# Patient Record
Sex: Male | Born: 1955 | Race: White | Hispanic: No | Smoking: Current every day smoker
Health system: Southern US, Community
[De-identification: ages and names within clinical notes are randomized; demographics above are authoritative.]

## PROBLEM LIST (undated history)

## (undated) DIAGNOSIS — F419 Anxiety disorder, unspecified: Secondary | ICD-10-CM

## (undated) DIAGNOSIS — E785 Hyperlipidemia, unspecified: Secondary | ICD-10-CM

## (undated) DIAGNOSIS — M109 Gout, unspecified: Secondary | ICD-10-CM

## (undated) DIAGNOSIS — I10 Essential (primary) hypertension: Secondary | ICD-10-CM

## (undated) DIAGNOSIS — H409 Unspecified glaucoma: Secondary | ICD-10-CM

## (undated) DIAGNOSIS — J449 Chronic obstructive pulmonary disease, unspecified: Secondary | ICD-10-CM

## (undated) HISTORY — DX: Hyperlipidemia, unspecified: E78.5

## (undated) HISTORY — DX: Anxiety disorder, unspecified: F41.9

## (undated) HISTORY — PX: HEMORRHOID SURGERY: SHX153

## (undated) HISTORY — DX: Chronic obstructive pulmonary disease, unspecified: J44.9

---

## 2002-09-22 ENCOUNTER — Encounter: Payer: Self-pay | Admitting: Family Medicine

## 2002-09-22 ENCOUNTER — Ambulatory Visit (HOSPITAL_COMMUNITY): Admission: RE | Admit: 2002-09-22 | Discharge: 2002-09-22 | Payer: Self-pay | Admitting: Family Medicine

## 2016-06-10 ENCOUNTER — Ambulatory Visit (HOSPITAL_COMMUNITY)
Admission: EM | Admit: 2016-06-10 | Discharge: 2016-06-10 | Disposition: A | Discharge status: Home / Self Care | Payer: 59 | Attending: Family Medicine | Admitting: Family Medicine

## 2016-06-10 ENCOUNTER — Ambulatory Visit (INDEPENDENT_AMBULATORY_CARE_PROVIDER_SITE_OTHER): Payer: 59

## 2016-06-10 ENCOUNTER — Encounter (HOSPITAL_COMMUNITY): Payer: Self-pay | Admitting: Nurse Practitioner

## 2016-06-10 DIAGNOSIS — M1000 Idiopathic gout, unspecified site: Secondary | ICD-10-CM | POA: Diagnosis not present

## 2016-06-10 HISTORY — DX: Unspecified glaucoma: H40.9

## 2016-06-10 HISTORY — DX: Essential (primary) hypertension: I10

## 2016-06-10 MED ORDER — COLCHICINE 0.6 MG PO TABS: 0.6000 mg | ORAL_TABLET | Freq: Two times a day (BID) | ORAL | 0 refills | Status: DC

## 2016-06-10 MED ORDER — NAPROXEN 500 MG PO TABS: 500.0000 mg | ORAL_TABLET | Freq: Two times a day (BID) | ORAL | 1 refills | Status: DC

## 2016-06-10 NOTE — ED Triage Notes (Signed)
Pt presents with c/o L foot pain and swelling. Onset after waking on Sunday. Pain has been increasingly worse since onset. He denies any injuries. Swelling, erythema and purple discoloration noted to medial aspect of L foot above great toe. Pulses intact, can wiggle digits.

## 2016-06-10 NOTE — ED Provider Notes (Signed)
[facility]    CSN: [id number] Arrival date & time: 06/10/16  [id number]     History   Chief Complaint Chief Complaint  Patient presents with  . Foot Pain    HPI Foy GuadalajaraKerry D Webb is a 60 y.o. male.   The history is provided by the patient.  Foot Pain  This is a new problem. The current episode started 2 days ago. The problem occurs constantly. The problem has been gradually worsening. The symptoms are aggravated by walking.    Past Medical History:  Diagnosis Date  . Glaucoma   . Hypertension     There are no active problems to display for this patient.   History reviewed. No pertinent surgical history.     Home Medications    Prior to Admission medications   Medication Sig Start Date End Date Taking? Authorizing Provider  latanoprost (XALATAN) 0.005 % ophthalmic solution 1 drop at bedtime.   Yes Historical Provider, MD    Family History History reviewed. No pertinent family history.  Social History Social History  Substance Use Topics  . Smoking status: Current Every Day Smoker    Packs/day: 1.00  . Smokeless tobacco: Not on file  . Alcohol use Yes     Allergies   Review of patient's allergies indicates no known allergies.   Review of Systems Review of Systems  Musculoskeletal: Positive for gait problem and joint swelling.  All other systems reviewed and are negative.    Physical Exam Triage Vital Signs ED Triage Vitals  Enc Vitals Group     BP 06/10/16 1024 (!) 194/107     Pulse Rate 06/10/16 1024 80     Resp 06/10/16 1024 16     Temp 06/10/16 1024 97.7 F (36.5 C)     Temp Source 06/10/16 1024 Oral     SpO2 06/10/16 1024 100 %     Weight --      Height --      Head Circumference --      Peak Flow --      Pain Score 06/10/16 1038 8     Pain Loc --      Pain Edu? --      Excl. in GC? --    No data found.   Updated Vital Signs BP (!) 194/107 (BP Location: Left Arm)   Pulse 80   Temp 97.7 F (36.5 C) (Oral)   Resp 16    SpO2 100%   Visual Acuity Right Eye Distance:   Left Eye Distance:   Bilateral Distance:    Right Eye Near:   Left Eye Near:    Bilateral Near:     Physical Exam  Constitutional: He is oriented to person, place, and time. He appears well-developed and well-nourished.  Musculoskeletal: He exhibits tenderness.       Left foot: There is decreased range of motion, tenderness and swelling.       Feet:  Neurological: He is alert and oriented to person, place, and time.  Nursing note and vitals reviewed.    UC Treatments / Results  Labs (all labs ordered are listed, but only abnormal results are displayed) Labs Reviewed - No data to display  EKG  EKG Interpretation None       Radiology No results found. X-rays reviewed and report per radiologist.  Procedures Procedures (including critical care time)  Medications Ordered in UC Medications - No data to display   Initial Impression / Assessment and Plan / UC  Course  I have reviewed the triage vital signs and the nursing notes.  Pertinent labs & imaging results that were available during my care of the patient were reviewed by me and considered in my medical decision making (see chart for details).  Clinical Course      Final Clinical Impressions(s) / UC Diagnoses   Final diagnoses:  None    New Prescriptions New Prescriptions   No medications on file     Linna Hoff, MD 06/13/16 2020

## 2016-09-11 ENCOUNTER — Emergency Department (HOSPITAL_COMMUNITY): Payer: PRIVATE HEALTH INSURANCE

## 2016-09-11 ENCOUNTER — Emergency Department (HOSPITAL_COMMUNITY)
Admission: EM | Admit: 2016-09-11 | Discharge: 2016-09-12 | Disposition: A | Discharge status: Home / Self Care | Payer: PRIVATE HEALTH INSURANCE | Attending: Emergency Medicine | Admitting: Emergency Medicine

## 2016-09-11 ENCOUNTER — Encounter (HOSPITAL_COMMUNITY): Payer: Self-pay | Admitting: Emergency Medicine

## 2016-09-11 DIAGNOSIS — Z79899 Other long term (current) drug therapy: Secondary | ICD-10-CM | POA: Insufficient documentation

## 2016-09-11 DIAGNOSIS — J441 Chronic obstructive pulmonary disease with (acute) exacerbation: Secondary | ICD-10-CM | POA: Diagnosis not present

## 2016-09-11 DIAGNOSIS — F17210 Nicotine dependence, cigarettes, uncomplicated: Secondary | ICD-10-CM | POA: Insufficient documentation

## 2016-09-11 DIAGNOSIS — I10 Essential (primary) hypertension: Secondary | ICD-10-CM | POA: Diagnosis not present

## 2016-09-11 DIAGNOSIS — R0602 Shortness of breath: Secondary | ICD-10-CM | POA: Diagnosis present

## 2016-09-11 HISTORY — DX: Gout, unspecified: M10.9

## 2016-09-11 LAB — COMPREHENSIVE METABOLIC PANEL
ALK PHOS: 42 U/L (ref 38–126)
ALT: 16 U/L — AB (ref 17–63)
ANION GAP: 11 (ref 5–15)
AST: 23 U/L (ref 15–41)
Albumin: 3.7 g/dL (ref 3.5–5.0)
BUN: 12 mg/dL (ref 6–20)
CALCIUM: 8.8 mg/dL — AB (ref 8.9–10.3)
CO2: 23 mmol/L (ref 22–32)
CREATININE: 1.22 mg/dL (ref 0.61–1.24)
Chloride: 100 mmol/L — ABNORMAL LOW (ref 101–111)
Glucose, Bld: 80 mg/dL (ref 65–99)
Potassium: 4.2 mmol/L (ref 3.5–5.1)
SODIUM: 134 mmol/L — AB (ref 135–145)
TOTAL PROTEIN: 7.2 g/dL (ref 6.5–8.1)
Total Bilirubin: 0.7 mg/dL (ref 0.3–1.2)

## 2016-09-11 LAB — CBC WITH DIFFERENTIAL/PLATELET
Basophils Absolute: 0 10*3/uL (ref 0.0–0.1)
Basophils Relative: 0 %
EOS ABS: 0 10*3/uL (ref 0.0–0.7)
EOS PCT: 0 %
HCT: 44.8 % (ref 39.0–52.0)
HEMOGLOBIN: 15.4 g/dL (ref 13.0–17.0)
LYMPHS ABS: 1.3 10*3/uL (ref 0.7–4.0)
LYMPHS PCT: 19 %
MCH: 32.7 pg (ref 26.0–34.0)
MCHC: 34.4 g/dL (ref 30.0–36.0)
MCV: 95.1 fL (ref 78.0–100.0)
MONOS PCT: 10 %
Monocytes Absolute: 0.7 10*3/uL (ref 0.1–1.0)
Neutro Abs: 4.8 10*3/uL (ref 1.7–7.7)
Neutrophils Relative %: 71 %
Platelets: 145 10*3/uL — ABNORMAL LOW (ref 150–400)
RBC: 4.71 MIL/uL (ref 4.22–5.81)
RDW: 12.7 % (ref 11.5–15.5)
WBC: 6.8 10*3/uL (ref 4.0–10.5)

## 2016-09-11 MED ORDER — IOPAMIDOL (ISOVUE-370) INJECTION 76%
INTRAVENOUS | Status: AC
  Administered 2016-09-11: 100 mL
  Filled 2016-09-11: qty 100

## 2016-09-11 MED ORDER — SODIUM CHLORIDE 0.9 % IV BOLUS (SEPSIS)
1000.0000 mL | Freq: Once | INTRAVENOUS | Status: AC
  Administered 2016-09-11: 1000 mL via INTRAVENOUS

## 2016-09-11 MED ORDER — ALBUTEROL SULFATE (2.5 MG/3ML) 0.083% IN NEBU
5.0000 mg | INHALATION_SOLUTION | Freq: Once | RESPIRATORY_TRACT | Status: AC
  Administered 2016-09-11: 5 mg via RESPIRATORY_TRACT
  Filled 2016-09-11: qty 6

## 2016-09-11 NOTE — ED Notes (Signed)
Pt transported to xray 

## 2016-09-11 NOTE — ED Notes (Signed)
Pt returned to room and placed back on monitor.  

## 2016-09-11 NOTE — ED Triage Notes (Signed)
Per [facility]: Pt to ED from home c/o SOB with exertion and productive cough x 1 week. Pt states the SOB has been there for about a year but significantly worsened this week with the cough. Pt stood up from couch, took a couple steps and felt SOB and dizzy. EMS VS: 186/120, 93% RA increased to 98% 2L O2 [place], CBG 86, HR 78 regular. Pt only known hx gout and glaucoma. Smokes 1ppd cigarettes. Resp currently e/u.

## 2016-09-11 NOTE — ED Notes (Signed)
Pt transported to CT ?

## 2016-09-12 LAB — INFLUENZA PANEL BY PCR (TYPE A & B)
Influenza A By PCR: NEGATIVE
Influenza B By PCR: NEGATIVE

## 2016-09-12 MED ORDER — LEVOFLOXACIN 500 MG PO TABS: 500.0000 mg | ORAL_TABLET | Freq: Every day | ORAL | 0 refills | Status: DC

## 2016-09-12 MED ORDER — ALBUTEROL SULFATE HFA 108 (90 BASE) MCG/ACT IN AERS
2.0000 | INHALATION_SPRAY | RESPIRATORY_TRACT | Status: DC
  Administered 2016-09-12: 2 via RESPIRATORY_TRACT
  Filled 2016-09-12: qty 6.7

## 2016-09-12 MED ORDER — METHYLPREDNISOLONE SODIUM SUCC 125 MG IJ SOLR: 125.0000 mg | Freq: Once | INTRAMUSCULAR | 0 refills | Status: DC

## 2016-09-12 MED ORDER — PREDNISONE 20 MG PO TABS: 40.0000 mg | ORAL_TABLET | Freq: Every day | ORAL | 0 refills | Status: AC

## 2016-09-12 MED ORDER — LEVOFLOXACIN 500 MG PO TABS
500.0000 mg | ORAL_TABLET | Freq: Once | ORAL | Status: AC
  Administered 2016-09-12: 500 mg via ORAL
  Filled 2016-09-12: qty 1

## 2016-09-12 MED ORDER — ALBUTEROL SULFATE HFA 108 (90 BASE) MCG/ACT IN AERS: 2.0000 | INHALATION_SPRAY | RESPIRATORY_TRACT | 0 refills | Status: DC | PRN

## 2016-09-12 MED ORDER — METHYLPREDNISOLONE SODIUM SUCC 125 MG IJ SOLR
125.0000 mg | Freq: Once | INTRAMUSCULAR | Status: AC
  Administered 2016-09-12: 125 mg via INTRAVENOUS
  Filled 2016-09-12: qty 2

## 2016-09-12 NOTE — ED Provider Notes (Signed)
[facility]   CSN: [id number] Arrival date & time: 09/11/16  2000     History   Chief Complaint Chief Complaint  Patient presents with  . Shortness of Breath    HPI Brent Holt is a 61 y.o. male.  HPI Patient presents to the emergency department with complaints of exertional shortness of breath and productive cough for the past week without active chest pain.  He continues to smoke cigarettes.  He's never been told he has emphysema or COPD.  He does not have bronchodilators at home.  He denies orthopnea.  Denies unilateral leg swelling.  No history of DVT report embolism.  Denies chest pain.  Symptoms are moderate in severity.  He received a breathing treatment prior to my arrival and feels better after bronchodilators   Past Medical History:  Diagnosis Date  . Glaucoma   . Gout   . Hypertension     There are no active problems to display for this patient.   History reviewed. No pertinent surgical history.     Home Medications    Prior to Admission medications   Medication Sig Start Date End Date Taking? Authorizing Provider  colchicine 0.6 MG tablet Take 1 tablet (0.6 mg total) by mouth 2 (two) times daily. Patient taking differently: Take 0.6 mg by mouth 2 (two) times daily as needed (gout pain).  06/10/16  Yes Linna Hoff, MD  dorzolamide-timolol (COSOPT) 22.3-6.8 MG/ML ophthalmic solution Place 1 drop into both eyes 2 (two) times daily.   Yes Historical Provider, MD  albuterol (PROVENTIL HFA;VENTOLIN HFA) 108 (90 Base) MCG/ACT inhaler Inhale 2 puffs into the lungs every 4 (four) hours as needed for wheezing or shortness of breath. 09/12/16   Azalia Bilis, MD  levofloxacin (LEVAQUIN) 500 MG tablet Take 1 tablet (500 mg total) by mouth daily. 09/13/16   Azalia Bilis, MD  predniSONE (DELTASONE) 20 MG tablet Take 2 tablets (40 mg total) by mouth daily. 09/12/16 09/17/16  Azalia Bilis, MD    Family History No family history on file.  Social  History Social History  Substance Use Topics  . Smoking status: Current Every Day Smoker    Packs/day: 1.00    Types: Cigarettes  . Smokeless tobacco: Never Used  . Alcohol use Yes     Allergies   Patient has no known allergies.   Review of Systems Review of Systems  All other systems reviewed and are negative.    Physical Exam Updated Vital Signs BP 155/99 (BP Location: Right Arm)   Pulse 76   Temp 98.3 F (36.8 C) (Oral)   Resp 18   Ht 6' (1.829 m)   Wt 155 lb (70.3 kg)   SpO2 96%   BMI 21.02 kg/m   Physical Exam  Constitutional: He is oriented to person, place, and time. He appears well-developed and well-nourished.  HENT:  Head: Normocephalic and atraumatic.  Eyes: EOM are normal.  Neck: Normal range of motion.  Cardiovascular: Normal rate, regular rhythm, normal heart sounds and intact distal pulses.   Pulmonary/Chest: Effort normal. No respiratory distress. He has wheezes.  Abdominal: Soft. He exhibits no distension. There is no tenderness.  Musculoskeletal: Normal range of motion.  Neurological: He is alert and oriented to person, place, and time.  Skin: Skin is warm and dry.  Psychiatric: He has a normal mood and affect. Judgment normal.  Nursing note and vitals reviewed.    ED Treatments / Results  Labs (all labs ordered are listed,  but only abnormal results are displayed) Labs Reviewed  CBC WITH DIFFERENTIAL/PLATELET - Abnormal; Notable for the following:       Result Value   Platelets 145 (*)    All other components within normal limits  COMPREHENSIVE METABOLIC PANEL - Abnormal; Notable for the following:    Sodium 134 (*)    Chloride 100 (*)    Calcium 8.8 (*)    ALT 16 (*)    All other components within normal limits  INFLUENZA PANEL BY PCR (TYPE A & B, H1N1)    EKG  EKG Interpretation  Date/Time:  Thursday September 11 2016 20:17:42 EST Ventricular Rate:  77 PR Interval:    QRS Duration: 100 QT Interval:  405 QTC  Calculation: 459 R Axis:   57 Text Interpretation:  Sinus rhythm Minimal ST depression, inferior leads Baseline wander in lead(s) V6 No old tracing to compare Confirmed by Perley Arthurs  MD, Caryn BeeKEVIN ([id number]) on 09/11/2016 9:25:02 PM       Radiology Dg Chest 2 View  Result Date: 09/11/2016 CLINICAL DATA:  Shortness of breath, productive cough. EXAM: CHEST  2 VIEW COMPARISON:  None available currently. FINDINGS: The heart size and mediastinal contours are within normal limits. No pneumothorax or pleural effusion is noted. Left lung is clear. Probable scarring seen in right mid lung with possible bulla formation. Possible nodular density is seen in this area as well. The visualized skeletal structures are unremarkable. IMPRESSION: Probable scarring with probable bulla formation seen in right midlung. Possible nodular density is seen in right midlung as well; CT scan of the chest is recommended for further evaluation. Electronically Signed   By: Lupita RaiderJames  Green Jr, M.D.   On: 09/11/2016 21:00   Ct Angio Chest Pe W And/or Wo Contrast  Result Date: 09/12/2016 CLINICAL DATA:  Exertional shortness of breath with abnormal chest x-ray EXAM: CT ANGIOGRAPHY CHEST WITH CONTRAST TECHNIQUE: Multidetector CT imaging of the chest was performed using the standard protocol during bolus administration of intravenous contrast. Multiplanar CT image reconstructions and MIPs were obtained to evaluate the vascular anatomy. CONTRAST:  100 mL Isovue 370 intravenous COMPARISON:  Chest x-ray 09/11/2016 FINDINGS: Cardiovascular: Satisfactory opacification of the pulmonary arteries to the segmental level. No evidence of pulmonary embolism. Atherosclerotic calcifications in the aorta. No aneurysm. No obvious dissection. Heart size nonenlarged. Trace pericardial effusion or thickening. Mediastinum/Nodes: Esophagus is within normal limits. No significant mediastinal adenopathy. subcentimeter nonspecific mediastinal lymph nodes. Thyroid gland normal.  Trachea is midline. Lungs/Pleura: Severe emphysematous changes are present bilaterally. No acute consolidation or pleural effusion is visualized. There is no pneumothorax. There are several pulmonary nodules. Within the subpleural left lower lobe, there is a 5 mm nodule, series 5, image number 94. Within the superior aspect of the right lower lobe, there is a 1.5 by 0.7 cm nodule. Slightly inferior and posterior to this on series 5, image number 88 is an additional hyperdense nodule measuring 0.9 by 0.6 cm. One of the nodules is felt to correspond to the radiographic abnormality. Upper Abdomen: No acute abnormality is visualized. Partially visualized small stones in the mid and upper pole of the left kidney. Musculoskeletal: No chest wall abnormality. No acute or significant osseous findings. Review of the MIP images confirms the above findings. IMPRESSION: 1. No CT evidence for acute pulmonary embolus or aortic dissection 2. Severe emphysematous changes bilaterally. Several bilateral pulmonary nodules are visualized, measuring up to 1.5 cm. No follow-up needed if patient is low-risk (and has no known or suspected primary  neoplasm). Non-contrast chest CT can be considered in 12 months if patient is high-risk. This recommendation follows the consensus statement: Guidelines for Management of Incidental Pulmonary Nodules Detected on CT Images: From the Fleischner Society 2017; Radiology 2017; 284:228-243. 3. Left kidney stones Electronically Signed   By: Jasmine Pang M.D.   On: 09/12/2016 00:05    Procedures Procedures (including critical care time)  Medications Ordered in ED Medications  albuterol (PROVENTIL HFA;VENTOLIN HFA) 108 (90 Base) MCG/ACT inhaler 2 puff (2 puffs Inhalation Given 09/12/16 0133)  albuterol (PROVENTIL) (2.5 MG/3ML) 0.083% nebulizer solution 5 mg (5 mg Nebulization Given 09/11/16 2103)  sodium chloride 0.9 % bolus 1,000 mL (0 mLs Intravenous Stopped 09/11/16 2322)  iopamidol (ISOVUE-370) 76  % injection (100 mLs  Contrast Given 09/11/16 2331)  methylPREDNISolone sodium succinate (SOLU-MEDROL) 125 mg/2 mL injection 125 mg (125 mg Intravenous Given 09/12/16 0133)  levofloxacin (LEVAQUIN) tablet 500 mg (500 mg Oral Given 09/12/16 0133)     Initial Impression / Assessment and Plan / ED Course  I have reviewed the triage vital signs and the nursing notes.  Pertinent labs & imaging results that were available during my care of the patient were reviewed by me and considered in my medical decision making (see chart for details).  Clinical Course     Patient feels better after bronchodilators.  Likely COPD exacerbation.  We'll start on a course of Levaquin despite the CT scan demonstrating no evidence of emphysema as hopefully this will decrease his risk of rebound to the ER/admission to the hospital.  Outpatient pulmonary follow-up for his pulmonary nodule as he will need repeat imaging in 6 months given his history of tobacco abuse.  I recommended that he stop smoking cigarettes.  Home with steroids, Levaquin, albuterol.  Patient understands return to the ER for new or worsening symptoms  Final Clinical Impressions(s) / ED Diagnoses   Final diagnoses:  COPD exacerbation (HCC)    New Prescriptions Discharge Medication List as of 09/12/2016  1:19 AM    START taking these medications   Details  albuterol (PROVENTIL HFA;VENTOLIN HFA) 108 (90 Base) MCG/ACT inhaler Inhale 2 puffs into the lungs every 4 (four) hours as needed for wheezing or shortness of breath., Starting Fri 09/12/2016, Print    levofloxacin (LEVAQUIN) 500 MG tablet Take 1 tablet (500 mg total) by mouth daily., Starting Sat 09/13/2016, Print    predniSONE (DELTASONE) 20 MG tablet Take 2 tablets (40 mg total) by mouth daily., Starting Fri 09/12/2016, Until Wed 09/17/2016, Print         Azalia Bilis, MD 09/12/16 [phone]

## 2016-09-12 NOTE — ED Notes (Addendum)
Patient verbalized understanding of discharge instructions and denies any further needs or questions at this time. VS stable. Patient ambulatory with steady gait.  

## 2016-09-12 NOTE — ED Notes (Signed)
MD at bedside. 

## 2016-10-07 ENCOUNTER — Ambulatory Visit (INDEPENDENT_AMBULATORY_CARE_PROVIDER_SITE_OTHER): Payer: 59 | Admitting: Emergency Medicine

## 2016-10-07 ENCOUNTER — Encounter: Payer: Self-pay | Admitting: Emergency Medicine

## 2016-10-07 VITALS — BP 190/110 | HR 84 | Ht 72.0 in | Wt 155.0 lb

## 2016-10-07 DIAGNOSIS — F17210 Nicotine dependence, cigarettes, uncomplicated: Secondary | ICD-10-CM | POA: Diagnosis not present

## 2016-10-07 DIAGNOSIS — J301 Allergic rhinitis due to pollen: Secondary | ICD-10-CM | POA: Diagnosis not present

## 2016-10-07 DIAGNOSIS — J431 Panlobular emphysema: Secondary | ICD-10-CM

## 2016-10-07 DIAGNOSIS — R918 Other nonspecific abnormal finding of lung field: Secondary | ICD-10-CM

## 2016-10-07 DIAGNOSIS — J449 Chronic obstructive pulmonary disease, unspecified: Secondary | ICD-10-CM | POA: Insufficient documentation

## 2016-10-07 DIAGNOSIS — Z720 Tobacco use: Secondary | ICD-10-CM | POA: Insufficient documentation

## 2016-10-07 DIAGNOSIS — J309 Allergic rhinitis, unspecified: Secondary | ICD-10-CM | POA: Insufficient documentation

## 2016-10-07 MED ORDER — FLUTICASONE PROPIONATE 50 MCG/ACT NA SUSP: 2.0000 | Freq: Every day | NASAL | 2 refills | Status: DC

## 2016-10-07 NOTE — Assessment & Plan Note (Addendum)
Discussed cessation with him today. He has cut down significantly. He knows that he needs to stop. We talked about plans to accomplish this. We did not set a quit date as he is not ready to do so. Discussed for 5 minutes

## 2016-10-07 NOTE — Assessment & Plan Note (Signed)
Significant emphysema and some areas of pulmonary nodularity noted on his CT scan of the chest done on 09/11/16. He is at high risk for possible lung cancer. We will repeat his CT scan in 6 months to look for interval change. Depending on the results we will plan next steps for further evaluation.

## 2016-10-07 NOTE — Assessment & Plan Note (Signed)
Significant chronic rhinitis and allergy symptoms. We will try a nasal steroid to see if he benefits.

## 2016-10-07 NOTE — Patient Instructions (Addendum)
We will perform pulmonary function testing at your next office visit.  Keep your albuterol available to use 2 puffs as needed for shortness of breath.  We will repeat your CT scan of the chest in 6 months to follow your pulmonary nodules.  Start fluticasone nasal spray, 2 sprays each nostril daily Follow with Dr Delton CoombesByrum next available with full PFT

## 2016-10-07 NOTE — Progress Notes (Signed)
Subjective:    Patient ID: Brent Holt, male    DOB: March 22, 1956, 61 y.o.   MRN: [id number]  HPI  61 year old man, current smoker (80 pack years) with hx HTN, gout, glaucoma, L rib fracture with a pneumothorax about 10 yrs ago. He went to the ED 09/12/16 with exertional dyspnea and a productive cough. ED notes indicate that he had wheezing on exam. He was treated with bronchodilators and did feel some improvement in his symptoms. He was treated with levofloxacin, prednisone, and given albuterol inhaler. He had never been diagnosed with COPD before and presents now for further evaluation. A Ct scan chest was done that shows emphysema, scattered pulm nodules. He heats w wood. He has some limitation on activities by his breathing.   Review of Systems  Constitutional: Negative.  Negative for fever and unexpected weight change.  HENT: Positive for congestion, rhinorrhea, sinus pressure and sneezing. Negative for dental problem, ear pain, nosebleeds, postnasal drip, sore throat and trouble swallowing.   Eyes: Negative.  Negative for redness and itching.  Respiratory: Positive for cough and shortness of breath. Negative for chest tightness and wheezing.   Cardiovascular: Negative.  Negative for palpitations and leg swelling.  Gastrointestinal: Negative.  Negative for nausea and vomiting.  Genitourinary: Negative.  Negative for dysuria.  Musculoskeletal: Negative.  Negative for joint swelling.  Skin: Negative.  Negative for rash.  Neurological: Negative.  Negative for headaches.  Hematological: Bruises/bleeds easily.  Psychiatric/Behavioral: Negative.  Negative for dysphoric mood. The patient is not nervous/anxious.     Past Medical History:  Diagnosis Date  . Glaucoma   . Gout   . Hypertension      No family history on file.   Social History   Social History  . Marital status: Married    Spouse name: N/A  . Number of children: N/A  . Years of education: N/A   Occupational History  .  Not on file.   Social History Main Topics  . Smoking status: Current Every Day Smoker    Packs/day: 0.50    Types: Cigarettes  . Smokeless tobacco: Never Used  . Alcohol use Yes  . Drug use: No  . Sexual activity: Not on file   Other Topics Concern  . Not on file   Social History Narrative  . 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 albuterol (PROVENTIL HFA;VENTOLIN HFA) 108 (90 Base) MCG/ACT inhaler Inhale 2 puffs into the lungs every 4 (four) hours as needed for wheezing or shortness of breath. 1 Inhaler 0  . dorzolamide-timolol (COSOPT) 22.3-6.8 MG/ML ophthalmic solution Place 1 drop into both eyes 2 (two) times daily.    . colchicine 0.6 MG tablet Take 1 tablet (0.6 mg total) by mouth 2 (two) times daily. (Patient not taking: Reported on 10/07/2016) 30 tablet 0  . levofloxacin (LEVAQUIN) 500 MG tablet Take 1 tablet (500 mg total) by mouth daily. (Patient not taking: Reported on 10/07/2016) 6 tablet 0   No facility-administered medications prior to visit.         Objective:   Physical Exam Vitals:   10/07/16 1625  BP: (!) 190/110  Pulse: 84  SpO2: 97%  Weight: 155 lb (70.3 kg)  Height: 6' (1.829 m)   Gen: Pleasant, well-nourished, in no distress,  normal affect  ENT:  No lesions,  mouth clear,  oropharynx clear, no postnasal drip  Neck: No JVD, no TMG, no carotid bruits  Lungs: No use of accessory muscles, clear without rales or rhonchi  Cardiovascular: RRR, heart sounds normal, no murmur or gallops, no peripheral edema  Musculoskeletal: No deformities, no cyanosis or clubbing  Neuro: alert, non focal  Skin: Warm, no lesions or rashes     09/11/16 --  COMPARISON:  Chest x-ray 09/11/2016  FINDINGS: Cardiovascular: Satisfactory opacification of the pulmonary arteries to the segmental  level. No evidence of pulmonary embolism. Atherosclerotic calcifications in the aorta. No aneurysm. No obvious dissection. Heart size nonenlarged. Trace pericardial effusion or thickening.  Mediastinum/Nodes: Esophagus is within normal limits. No significant mediastinal adenopathy. subcentimeter nonspecific mediastinal lymph nodes. Thyroid gland normal. Trachea is midline.  Lungs/Pleura: Severe emphysematous changes are present bilaterally. No acute consolidation or pleural effusion is visualized. There is no pneumothorax. There are several pulmonary nodules. Within the subpleural left lower lobe, there is a 5 mm nodule, series 5, image number 94. Within the superior aspect of the right lower lobe, there is a 1.5 by 0.7 cm nodule. Slightly inferior and posterior to this on series 5, image number 88 is an additional hyperdense nodule measuring 0.9 by 0.6 cm. One of the nodules is felt to correspond to the radiographic abnormality.  Upper Abdomen: No acute abnormality is visualized. Partially visualized small stones in the mid and upper pole of the left kidney.  Musculoskeletal: No chest wall abnormality. No acute or significant osseous findings.  Review of the MIP images confirms the above findings.  IMPRESSION: 1. No CT evidence for acute pulmonary embolus or aortic dissection 2. Severe emphysematous changes bilaterally. Several bilateral pulmonary nodules are visualized, measuring up to 1.5 cm. No follow-up needed if patient is low-risk (and has no known or suspected primary neoplasm). Non-contrast chest CT can be considered in 12 months if patient is high-risk. This recommendation follows the consensus statement: Guidelines for Management of Incidental Pulmonary Nodules Detected on CT Images: From the Fleischner Society 2017; Radiology 2017; 284:228-243. 3. Left kidney stones      Assessment & Plan:  COPD (chronic obstructive pulmonary disease) (HCC) This is a new  diagnosis that was made in the setting of a recent exacerbation. Clearly based on his CT scan of the chest and the severity of his exacerbation he does have some degree of COPD, probably significant. He needs pulmonary function testing to quantify. He will likely also need to be on scheduled bronchodilators. For now I will keep him on albuterol as needed. We will discuss which scheduled medication to initiate at his next visit.  Allergic rhinitis Significant chronic rhinitis and allergy symptoms. We will try a nasal steroid to see if he benefits.  Pulmonary nodules Significant emphysema and some areas of pulmonary nodularity noted on his CT scan of the chest done on 09/11/16. He is at high risk for possible lung cancer. We will repeat his CT scan in 6 months to look for interval change. Depending on the results we will plan next steps for further evaluation.  Tobacco abuse Discussed cessation with him today. He has cut down significantly. He knows that he needs to stop. We talked about plans to accomplish this. We did not set a quit date as he is not ready to do so. Discussed for 5 minutes  Levy Pupaobert Karryn Kosinski, MD, PhD 10/07/2016, 5:11 PM [facility] [phone] or if no answer [phone]

## 2016-10-07 NOTE — Assessment & Plan Note (Signed)
This is a new diagnosis that was made in the setting of a recent exacerbation. Clearly based on his CT scan of the chest and the severity of his exacerbation he does have some degree of COPD, probably significant. He needs pulmonary function testing to quantify. He will likely also need to be on scheduled bronchodilators. For now I will keep him on albuterol as needed. We will discuss which scheduled medication to initiate at his next visit.

## 2016-11-17 ENCOUNTER — Ambulatory Visit: Payer: 59 | Admitting: Emergency Medicine

## 2016-11-24 ENCOUNTER — Ambulatory Visit: Payer: 59 | Attending: Family Medicine | Admitting: Family Medicine

## 2016-11-24 ENCOUNTER — Encounter: Payer: Self-pay | Admitting: Family Medicine

## 2016-11-24 VITALS — BP 176/101 | HR 65 | Temp 97.5°F | Resp 18 | Ht 72.0 in | Wt 140.6 lb

## 2016-11-24 DIAGNOSIS — Z720 Tobacco use: Secondary | ICD-10-CM | POA: Diagnosis not present

## 2016-11-24 DIAGNOSIS — H409 Unspecified glaucoma: Secondary | ICD-10-CM | POA: Insufficient documentation

## 2016-11-24 DIAGNOSIS — Z7689 Persons encountering health services in other specified circumstances: Secondary | ICD-10-CM | POA: Diagnosis not present

## 2016-11-24 DIAGNOSIS — J449 Chronic obstructive pulmonary disease, unspecified: Secondary | ICD-10-CM | POA: Diagnosis not present

## 2016-11-24 DIAGNOSIS — Z79899 Other long term (current) drug therapy: Secondary | ICD-10-CM | POA: Diagnosis not present

## 2016-11-24 DIAGNOSIS — J431 Panlobular emphysema: Secondary | ICD-10-CM | POA: Diagnosis not present

## 2016-11-24 DIAGNOSIS — Z0000 Encounter for general adult medical examination without abnormal findings: Secondary | ICD-10-CM

## 2016-11-24 DIAGNOSIS — F17210 Nicotine dependence, cigarettes, uncomplicated: Secondary | ICD-10-CM | POA: Insufficient documentation

## 2016-11-24 DIAGNOSIS — Z8669 Personal history of other diseases of the nervous system and sense organs: Secondary | ICD-10-CM | POA: Diagnosis not present

## 2016-11-24 DIAGNOSIS — I10 Essential (primary) hypertension: Secondary | ICD-10-CM | POA: Diagnosis not present

## 2016-11-24 LAB — HEPATITIS C ANTIBODY: HCV AB: NEGATIVE

## 2016-11-24 LAB — BASIC METABOLIC PANEL WITH GFR
BUN: 12 mg/dL (ref 7–25)
CHLORIDE: 102 mmol/L (ref 98–110)
CO2: 26 mmol/L (ref 20–31)
Calcium: 8.9 mg/dL (ref 8.6–10.3)
Creat: 0.95 mg/dL (ref 0.70–1.25)
GFR, Est African American: >89 mL/min (ref >=60)
GFR, Est Non African American: 86 mL/min (ref >=60)
GLUCOSE: 85 mg/dL (ref 65–99)
POTASSIUM: 3.9 mmol/L (ref 3.5–5.3)
Sodium: 134 mmol/L — ABNORMAL LOW (ref 135–146)

## 2016-11-24 LAB — LIPID PANEL
CHOL/HDL RATIO: 2.5 ratio (ref <5.0)
CHOLESTEROL: 146 mg/dL (ref <200)
HDL: 58 mg/dL (ref >40)
LDL Cholesterol: 77 mg/dL (ref <100)
Triglycerides: 56 mg/dL (ref <150)
VLDL: 11 mg/dL (ref <30)

## 2016-11-24 MED ORDER — ALBUTEROL SULFATE HFA 108 (90 BASE) MCG/ACT IN AERS: 2.0000 | INHALATION_SPRAY | Freq: Four times a day (QID) | RESPIRATORY_TRACT | 2 refills | Status: DC | PRN

## 2016-11-24 MED ORDER — LISINOPRIL 10 MG PO TABS: 20.0000 mg | ORAL_TABLET | Freq: Every day | ORAL | 2 refills | Status: DC

## 2016-11-24 MED ORDER — CLONIDINE HCL 0.2 MG PO TABS
0.2000 mg | ORAL_TABLET | Freq: Once | ORAL | Status: AC
  Administered 2016-11-24: 0.2 mg via ORAL

## 2016-11-24 MED ORDER — HYDROCHLOROTHIAZIDE 25 MG PO TABS: 25.0000 mg | ORAL_TABLET | Freq: Every day | ORAL | 2 refills | Status: DC

## 2016-11-24 NOTE — Progress Notes (Signed)
Subjective:  Patient ID: Brent Holt, male    DOB: 1956/08/24  Age: 61 y.o. MRN: [id number]  CC: Establish Care   HPI Brent GuadalajaraKerry D Huckeba presents for   COPD: Reports upcoming appointment with Dr.Byrum at [facility] 4/12 for lung tests. Denies any SOB since ED visit.    HTN: Current smoker reports smoking 1 pack a day, down from 2 packs per day. 41 year smoking history. Denies any CP,SOB, or swelling of BLE. He is not ready to quit at this time.  Glaucoma: Glaucoma 2 years ago 2016. Reports upcoming appt.with his ophthalmologist Dr.Murdock in April.    Outpatient Medications Prior to Visit  Medication Sig Dispense Refill  . dorzolamide-timolol (COSOPT) 22.3-6.8 MG/ML ophthalmic solution Place 1 drop into both eyes 2 (two) times daily.    . fluticasone (FLONASE) 50 MCG/ACT nasal spray Place 2 sprays into both nostrils daily. 16 g 2  . levofloxacin (LEVAQUIN) 500 MG tablet Take 1 tablet (500 mg total) by mouth daily. (Patient not taking: Reported on 10/07/2016) 6 tablet 0  . albuterol (PROVENTIL HFA;VENTOLIN HFA) 108 (90 Base) MCG/ACT inhaler Inhale 2 puffs into the lungs every 4 (four) hours as needed for wheezing or shortness of breath. 1 Inhaler 0  . colchicine 0.6 MG tablet Take 1 tablet (0.6 mg total) by mouth 2 (two) times daily. (Patient not taking: Reported on 10/07/2016) 30 tablet 0   No facility-administered medications prior to visit.     ROS Review of Systems  Eyes:       History of glaucoma.   Respiratory: Negative.   Cardiovascular: Negative.   Gastrointestinal: Negative.      Objective:  BP (!) 176/101   Pulse 65   Temp 97.5 F (36.4 C) (Oral)   Resp 18   Ht 6' (1.829 m)   Wt 140 lb 9.6 oz (63.8 kg)   SpO2 98%   BMI 19.07 kg/m   BP/Weight 11/24/2016 10/07/2016 09/12/2016  Systolic BP 176 190 155  Diastolic BP 101 110 99  Wt. (Lbs) 140.6 155 -  BMI 19.07 21.02 -     Physical Exam  Eyes: Conjunctivae are normal. Pupils are equal, round, and reactive to  light.  Neck: No JVD present.  Cardiovascular: Normal rate, regular rhythm, normal heart sounds and intact distal pulses.   Pulmonary/Chest: Effort normal and breath sounds normal.  Abdominal: Soft. Bowel sounds are normal.  Skin: Skin is warm and dry.  Nursing note and vitals reviewed.   Assessment & Plan:   Problem List Items Addressed This Visit      Respiratory   COPD (chronic obstructive pulmonary disease) (HCC)   Relevant Medications   albuterol (PROVENTIL HFA;VENTOLIN HFA) 108 (90 Base) MCG/ACT inhaler     Other   Tobacco abuse    Other Visit Diagnoses    Essential hypertension    -  Primary   Relevant Medications   cloNIDine (CATAPRES) tablet 0.2 mg (Completed)   lisinopril (PRINIVIL,ZESTRIL) 10 MG tablet   hydrochlorothiazide (HYDRODIURIL) 25 MG tablet   Other Relevant Orders   BASIC METABOLIC PANEL WITH GFR (Completed)   Lipid Panel (Completed)   Microalbumin/Creatinine Ratio, Urine (Completed)   History of glaucoma       - Pt.reports upcoming appointment with his ophthalmologist.    Healthcare maintenance       Relevant Orders   Hepatitis C Antibody (Completed)      Meds ordered this encounter  Medications  . cloNIDine (CATAPRES) tablet 0.2 mg  .  albuterol (PROVENTIL HFA;VENTOLIN HFA) 108 (90 Base) MCG/ACT inhaler    Sig: Inhale 2 puffs into the lungs every 6 (six) hours as needed for wheezing or shortness of breath.    Dispense:  1 Inhaler    Refill:  2    Order Specific Question:   Supervising Provider    Answer:   Quentin Angst [id number]  . lisinopril (PRINIVIL,ZESTRIL) 10 MG tablet    Sig: Take 2 tablets (20 mg total) by mouth daily.    Dispense:  30 tablet    Refill:  2    Order Specific Question:   Supervising Provider    Answer:   Quentin Angst [id number]  . hydrochlorothiazide (HYDRODIURIL) 25 MG tablet    Sig: Take 1 tablet (25 mg total) by mouth daily.    Dispense:  30 tablet    Refill:  2    Order Specific Question:    Supervising Provider    Answer:   Quentin Angst [[id number]]    Follow-up: Return in about 2 weeks (around 12/08/2016) for HTN & Physical .   Lizbeth Bark FNP

## 2016-11-24 NOTE — Progress Notes (Signed)
Patient is here for HTN  Patient denies pain today  Patient is not on any BP med

## 2016-11-24 NOTE — Patient Instructions (Addendum)
Schedule appointment for blood pressure check and physical exam. Keep follow up appt.with your pulmonologist and ophthalmologist.  You will be called with your labs results.   Hypertension Hypertension is another name for high blood pressure. High blood pressure forces your heart to work harder to pump blood. This can cause problems over time. There are two numbers in a blood pressure reading. There is a top number (systolic) over a bottom number (diastolic). It is best to have a blood pressure below 120/80. Healthy choices can help lower your blood pressure. You may need medicine to help lower your blood pressure if:  Your blood pressure cannot be lowered with healthy choices.  Your blood pressure is higher than 130/80. Follow these instructions at home: Eating and drinking   If directed, follow the DASH eating plan. This diet includes:  Filling half of your plate at each meal with fruits and vegetables.  Filling one quarter of your plate at each meal with whole grains. Whole grains include whole wheat pasta, brown rice, and whole grain bread.  Eating or drinking low-fat dairy products, such as skim milk or low-fat yogurt.  Filling one quarter of your plate at each meal with low-fat (lean) proteins. Low-fat proteins include fish, skinless chicken, eggs, beans, and tofu.  Avoiding fatty meat, cured and processed meat, or chicken with skin.  Avoiding premade or processed food.  Eat less than 1,500 mg of salt (sodium) a day.  Limit alcohol use to no more than 1 drink a day for nonpregnant women and 2 drinks a day for men. One drink equals 12 oz of beer, 5 oz of wine, or 1 oz of hard liquor. Lifestyle   Work with your doctor to stay at a healthy weight or to lose weight. Ask your doctor what the best weight is for you.  Get at least 30 minutes of exercise that causes your heart to beat faster (aerobic exercise) most days of the week. This may include walking, swimming, or  biking.  Get at least 30 minutes of exercise that strengthens your muscles (resistance exercise) at least 3 days a week. This may include lifting weights or pilates.  Do not use any products that contain nicotine or tobacco. This includes cigarettes and e-cigarettes. If you need help quitting, ask your doctor.  Check your blood pressure at home as told by your doctor.  Keep all follow-up visits as told by your doctor. This is important. Medicines   Take over-the-counter and prescription medicines only as told by your doctor. Follow directions carefully.  Do not skip doses of blood pressure medicine. The medicine does not work as well if you skip doses. Skipping doses also puts you at risk for problems.  Ask your doctor about side effects or reactions to medicines that you should watch for. Contact a doctor if:  You think you are having a reaction to the medicine you are taking.  You have headaches that keep coming back (recurring).  You feel dizzy.  You have swelling in your ankles.  You have trouble with your vision. Get help right away if:  You get a very bad headache.  You start to feel confused.  You feel weak or numb.  You feel faint.  You get very bad pain in your:  Chest.  Belly (abdomen).  You throw up (vomit) more than once.  You have trouble breathing. Summary  Hypertension is another name for high blood pressure.  Making healthy choices can help lower blood pressure. If  your blood pressure cannot be controlled with healthy choices, you may need to take medicine. This information is not intended to replace advice given to you by your health care provider. Make sure you discuss any questions you have with your health care provider. Document Released: 02/11/2008 Document Revised: 07/23/2016 Document Reviewed: 07/23/2016 Elsevier Interactive Patient Education  2017 [employer]     Chronic Obstructive Pulmonary Disease Chronic obstructive pulmonary  disease (COPD) is a long-term (chronic) lung problem. When you have COPD, it is hard for air to get in and out of your lungs. The way your lungs work will never return to normal. Usually the condition gets worse over time. There are things you can do to keep yourself as healthy as possible. Your doctor may treat your condition with:  Medicines.  Quitting smoking, if you smoke.  Rehabilitation. This may involve a team of specialists.  Oxygen.  Exercise and changes to your diet.  Lung surgery.  Comfort measures (palliative care). Follow these instructions at home: Medicines   Take over-the-counter and prescription medicines only as told by your doctor.  Talk to your doctor before taking any cough or allergy medicines. You may need to avoid medicines that cause your lungs to be dry. Lifestyle   If you smoke, stop. Smoking makes the problem worse. If you need help quitting, ask your doctor.  Avoid being around things that make your breathing worse. This may include smoke, chemicals, and fumes.  Stay active, but remember to also rest.  Learn and use tips on how to relax.  Make sure you get enough sleep. Most adults need at least 7 hours a night.  Eat healthy foods. Eat smaller meals more often. Rest before meals. Controlled breathing   Learn and use tips on how to control your breathing as told by your doctor. Try:  Breathing in (inhaling) through your nose for 1 second. Then, pucker your lips and breath out (exhale) through your lips for 2 seconds.  Putting one hand on your belly (abdomen). Breathe in slowly through your nose for 1 second. Your hand on your belly should move out. Pucker your lips and breathe out slowly through your lips. Your hand on your belly should move in as you breathe out. Controlled coughing   Learn and use controlled coughing to clear mucus from your lungs. The steps are: 1. Lean your head a little forward. 2. Breathe in deeply. 3. Try to hold your  breath for 3 seconds. 4. Keep your mouth slightly open while coughing 2 times. 5. Spit any mucus out into a tissue. 6. Rest and do the steps again 1 or 2 times as needed. General instructions   Make sure you get all the shots (vaccines) that your doctor recommends. Ask your doctor about a flu shot and a pneumonia shot.  Use oxygen therapy and therapy to help improve your lungs (pulmonary rehabilitation) if told by your doctor. If you need home oxygen therapy, ask your doctor if you should buy a tool to measure your oxygen level (oximeter).  Make a COPD action plan with your doctor. This helps you know what to do if you feel worse than usual.  Manage any other conditions you have as told by your doctor.  Avoid going outside when it is very hot, cold, or humid.  Avoid people who have a sickness you can catch (contagious).  Keep all follow-up visits as told by your doctor. This is important. Contact a doctor if:  You cough up  more mucus than usual.  There is a change in the color or thickness of the mucus.  It is harder to breathe than usual.  Your breathing is faster than usual.  You have trouble sleeping.  You need to use your medicines more often than usual.  You have trouble doing your normal activities such as getting dressed or walking around the house. Get help right away if:  You have shortness of breath while resting.  You have shortness of breath that stops you from:  Being able to talk.  Doing normal activities.  Your chest hurts for longer than 5 minutes.  Your skin color is more blue than usual.  Your pulse oximeter shows that you have low oxygen for longer than 5 minutes.  You have a fever.  You feel too tired to breathe normally. Summary  Chronic obstructive pulmonary disease (COPD) is a long-term lung problem.  The way your lungs work will never return to normal. Usually the condition gets worse over time. There are things you can do to keep  yourself as healthy as possible.  Take over-the-counter and prescription medicines only as told by your doctor.  If you smoke, stop. Smoking makes the problem worse. This information is not intended to replace advice given to you by your health care provider. Make sure you discuss any questions you have with your health care provider. Document Released: 02/11/2008 Document Revised: 01/31/2016 Document Reviewed: 04/21/2013 Elsevier Interactive Patient Education  2017 [employer].

## 2016-11-25 ENCOUNTER — Other Ambulatory Visit: Payer: Self-pay | Admitting: Family Medicine

## 2016-11-25 DIAGNOSIS — R809 Proteinuria, unspecified: Secondary | ICD-10-CM

## 2016-11-25 DIAGNOSIS — E782 Mixed hyperlipidemia: Secondary | ICD-10-CM

## 2016-11-25 LAB — MICROALBUMIN / CREATININE URINE RATIO
Creatinine, Urine: 14 mg/dL — ABNORMAL LOW (ref 20–370)
MICROALB UR: 0.6 mg/dL
MICROALB/CREAT RATIO: 43 ug/mg{creat} — AB (ref <30)

## 2016-11-25 MED ORDER — ATORVASTATIN CALCIUM 20 MG PO TABS: 20.0000 mg | ORAL_TABLET | Freq: Every day | ORAL | 2 refills | Status: DC

## 2016-11-26 ENCOUNTER — Telehealth: Payer: Self-pay

## 2016-11-26 NOTE — Telephone Encounter (Signed)
-----   Message from Lizbeth BarkMandesia R Hairston, FNP sent at 11/25/2016  7:52 PM EDT ----- Lipid levels were elevated. This can increase your risk of heart disease. You have been prescribed atorvastatin to help lower these levels. Recheck lipid labs in 3 months.  Microalbumin/creatinine ratio level was elevated. This tests for protein in your urine that can indicate early signs of kidney damage. Take your lisinopril to lower your risk. Recheck lab in 3 months.  Kidney function normal Hepatitis C is negative.

## 2016-11-26 NOTE — Telephone Encounter (Signed)
CMA call to go over lab results  Patient verify DOB  Patient was aware and understood   

## 2016-12-09 ENCOUNTER — Encounter: Payer: Self-pay | Admitting: Family Medicine

## 2016-12-09 ENCOUNTER — Ambulatory Visit: Payer: PRIVATE HEALTH INSURANCE | Attending: Family Medicine | Admitting: Family Medicine

## 2016-12-09 ENCOUNTER — Other Ambulatory Visit: Payer: Self-pay | Admitting: Family Medicine

## 2016-12-09 VITALS — BP 154/101 | HR 61 | Temp 98.5°F | Resp 18 | Ht 72.0 in | Wt 137.0 lb

## 2016-12-09 DIAGNOSIS — R238 Other skin changes: Secondary | ICD-10-CM

## 2016-12-09 DIAGNOSIS — K4090 Unilateral inguinal hernia, without obstruction or gangrene, not specified as recurrent: Secondary | ICD-10-CM | POA: Insufficient documentation

## 2016-12-09 DIAGNOSIS — Z0000 Encounter for general adult medical examination without abnormal findings: Secondary | ICD-10-CM | POA: Insufficient documentation

## 2016-12-09 DIAGNOSIS — Z79899 Other long term (current) drug therapy: Secondary | ICD-10-CM | POA: Diagnosis not present

## 2016-12-09 DIAGNOSIS — J449 Chronic obstructive pulmonary disease, unspecified: Secondary | ICD-10-CM | POA: Insufficient documentation

## 2016-12-09 DIAGNOSIS — R143 Flatulence: Secondary | ICD-10-CM | POA: Insufficient documentation

## 2016-12-09 DIAGNOSIS — F17210 Nicotine dependence, cigarettes, uncomplicated: Secondary | ICD-10-CM | POA: Diagnosis not present

## 2016-12-09 DIAGNOSIS — M109 Gout, unspecified: Secondary | ICD-10-CM | POA: Insufficient documentation

## 2016-12-09 DIAGNOSIS — I10 Essential (primary) hypertension: Secondary | ICD-10-CM | POA: Insufficient documentation

## 2016-12-09 DIAGNOSIS — H409 Unspecified glaucoma: Secondary | ICD-10-CM | POA: Diagnosis not present

## 2016-12-09 DIAGNOSIS — R233 Spontaneous ecchymoses: Secondary | ICD-10-CM

## 2016-12-09 DIAGNOSIS — Z7689 Persons encountering health services in other specified circumstances: Secondary | ICD-10-CM | POA: Insufficient documentation

## 2016-12-09 DIAGNOSIS — L988 Other specified disorders of the skin and subcutaneous tissue: Secondary | ICD-10-CM | POA: Insufficient documentation

## 2016-12-09 MED ORDER — AMLODIPINE BESYLATE 10 MG PO TABS: 5.0000 mg | ORAL_TABLET | Freq: Every day | ORAL | 2 refills | Status: DC

## 2016-12-09 MED ORDER — CLONIDINE HCL 0.2 MG PO TABS
0.2000 mg | ORAL_TABLET | Freq: Once | ORAL | Status: AC
  Administered 2016-12-09: 0.2 mg via ORAL

## 2016-12-09 MED ORDER — SIMETHICONE 80 MG PO CHEW: 80.0000 mg | CHEWABLE_TABLET | Freq: Four times a day (QID) | ORAL | 0 refills | Status: DC | PRN

## 2016-12-09 MED ORDER — LISINOPRIL 40 MG PO TABS: 40.0000 mg | ORAL_TABLET | Freq: Every day | ORAL | 2 refills | Status: DC

## 2016-12-09 NOTE — Progress Notes (Signed)
Subjective:   Patient ID: Brent Holt, male    DOB: February 18, 1956, 61 y.o.   MRN: [id number]  Chief Complaint  Patient presents with  . Establish Care   HPI Brent Holt 61 y.o. male presents with   Annual physical examination: History of HTN. Current everyday smoker with 41 year smoking history. Reports 1 to 2 packs per day. Denies any CP,SOB, or swelling of BLE. He is not ready to quit at this time. History of COPD being followed by pulmonologist. Denies any SOB or increased inhaler use. History of glaucoma he reports 2 year ago. Reports easy skin bruising. Denies any use of anticoagulation,  aspirin therapy, or family history of bleeding disorders.C/o skin lump to right sided groin area 5 to 6 years ago. Denies pain or history of injury. Reports hemorrhoidectomy several years ago. C/o flatulence requesting medication to help. Denies any abdominal pain bowel or bladder changes, melena, or hematochezia.    Past Medical History:  Diagnosis Date  . Glaucoma   . Gout   . Hypertension     No past surgical history on file.  No family history on file.  Social History   Social History  . Marital status: Married    Spouse name: N/A  . Number of children: N/A  . Years of education: N/A   Occupational History  . Not on file.   Social History Main Topics  . Smoking status: Current Every Day Smoker    Packs/day: 0.50    Types: Cigarettes  . Smokeless tobacco: Never Used  . Alcohol use Yes  . Drug use: No  . Sexual activity: Not on file   Other Topics Concern  . Not on file   Social History Narrative  . No narrative on file    Outpatient Medications Prior to Visit  Medication Sig Dispense Refill  . albuterol (PROVENTIL HFA;VENTOLIN HFA) 108 (90 Base) MCG/ACT inhaler Inhale 2 puffs into the lungs every 6 (six) hours as needed for wheezing or shortness of breath. 1 Inhaler 2  . atorvastatin (LIPITOR) 20 MG tablet Take 1 tablet (20 mg total) by mouth daily. 30 tablet 2  .  dorzolamide-timolol (COSOPT) 22.3-6.8 MG/ML ophthalmic solution Place 1 drop into both eyes 2 (two) times daily.    . fluticasone (FLONASE) 50 MCG/ACT nasal spray Place 2 sprays into both nostrils daily. 16 g 2  . levofloxacin (LEVAQUIN) 500 MG tablet Take 1 tablet (500 mg total) by mouth daily. (Patient not taking: Reported on 10/07/2016) 6 tablet 0  . hydrochlorothiazide (HYDRODIURIL) 25 MG tablet Take 1 tablet (25 mg total) by mouth daily. 30 tablet 2  . lisinopril (PRINIVIL,ZESTRIL) 10 MG tablet Take 2 tablets (20 mg total) by mouth daily. 30 tablet 2   No facility-administered medications prior to visit.     No Known Allergies  Review of Systems  Constitutional: Positive for malaise/fatigue.  HENT: Negative.   Eyes: Negative.   Respiratory: Negative.   Cardiovascular: Negative.   Gastrointestinal: Negative.        Flatulence.  Genitourinary: Negative.   Musculoskeletal: Negative.   Skin: Negative.   Endo/Heme/Allergies: Bruises/bleeds easily.     Objective:    Physical Exam  Constitutional: He is oriented to person, place, and time. He appears well-developed and well-nourished.  HENT:  Head: Normocephalic and atraumatic.  Right Ear: External ear normal.  Left Ear: External ear normal.  Nose: Nose normal.  Mouth/Throat: Oropharynx is clear and moist.  Eyes: wears corrective lenses.  Eyes: Conjunctivae  and EOM are normal. Pupils are equal, round, and reactive to light.  Neck: Normal range of motion. Neck supple.  Cardiovascular: Normal rate, regular rhythm, normal heart sounds and intact distal pulses.   Pulmonary/Chest: Effort normal and breath sounds normal.  Abdominal: Soft. Bowel sounds are normal.  Musculoskeletal: Normal range of motion.  Neurological: He is alert and oriented to person, place, and time. He has normal reflexes.  Skin: Skin is warm and dry. Bruising (generalized maroon, BUE arm and hands) noted.  Psychiatric: He has a normal mood and affect. His  behavior is normal. Judgment and thought content normal.  Nursing note and vitals reviewed.   BP (!) 154/101   Pulse 61   Temp 98.5 F (36.9 C) (Oral)   Resp 18   Ht 6' (1.829 m)   Wt 137 lb (62.1 kg)   SpO2 97%   BMI 18.58 kg/m  Wt Readings from Last 3 Encounters:  12/09/16 137 lb (62.1 kg)  11/24/16 140 lb 9.6 oz (63.8 kg)  10/07/16 155 lb (70.3 kg)    Lab Results  Component Value Date   TSH 1.350 12/09/2016   Lab Results  Component Value Date   WBC 6.5 12/09/2016   HGB 15.4 09/11/2016   HCT 44.6 12/09/2016   MCV 96 12/09/2016   PLT 225 12/09/2016   Lab Results  Component Value Date   NA 130 (L) 12/09/2016   K 4.0 12/09/2016   CO2 25 12/09/2016   GLUCOSE 108 (H) 12/09/2016   BUN 20 12/09/2016   CREATININE 1.22 12/09/2016   BILITOT 0.5 12/09/2016   ALKPHOS 52 12/09/2016   AST 18 12/09/2016   ALT 15 12/09/2016   PROT 7.1 12/09/2016   ALBUMIN 4.3 12/09/2016   CALCIUM 8.8 12/09/2016   ANIONGAP 11 09/11/2016   Lab Results  Component Value Date   CHOL 146 11/24/2016   Lab Results  Component Value Date   HDL 58 11/24/2016   Lab Results  Component Value Date   LDLCALC 77 11/24/2016   Lab Results  Component Value Date   TRIG 56 11/24/2016   Lab Results  Component Value Date   CHOLHDL 2.5 11/24/2016   Lab Results  Component Value Date   HGBA1C 5.3 12/09/2016       Assessment & Plan:   Problem List Items Addressed This Visit    None    Visit Diagnoses    Annual physical exam    -  Primary   Relevant Orders   CMP14+EGFR (Completed)   Hemoglobin A1c (Completed)   TSH (Completed)   PSA (Completed)   VITAMIN D 25 Hydroxy (Vit-D Deficiency, Fractures) (Completed)   Ambulatory referral to Dentistry   CBC with Differential/Platelet (Completed)   Essential hypertension       -Schedule BP recheck in 2 weeks with clinical pharmacist.   - Follow up with PCP in 3 months for HTN   Relevant Medications   cloNIDine (CATAPRES) tablet 0.2 mg  (Completed)   lisinopril (PRINIVIL,ZESTRIL) 40 MG tablet   amLODipine (NORVASC) 10 MG tablet   Healthcare maintenance       Relevant Orders   Ambulatory referral to Gastroenterology   Flatulence       Relevant Medications   simethicone (MYLICON) 80 MG chewable tablet   Hernia, inguinal, left       Relevant Orders   Ambulatory referral to General Surgery   Easy bruising       Relevant Orders   Protime-INR (Completed)  CBC with Differential/Platelet (Completed)      Meds ordered this encounter  Medications  . cloNIDine (CATAPRES) tablet 0.2 mg  . lisinopril (PRINIVIL,ZESTRIL) 40 MG tablet    Sig: Take 1 tablet (40 mg total) by mouth daily.    Dispense:  30 tablet    Refill:  2    Order Specific Question:   Supervising Provider    Answer:   Tresa Garter [id number]  . simethicone (MYLICON) 80 MG chewable tablet    Sig: Chew 1 tablet (80 mg total) by mouth every 6 (six) hours as needed for flatulence.    Dispense:  30 tablet    Refill:  0    Order Specific Question:   Supervising Provider    Answer:   Tresa Garter [id number]  . amLODipine (NORVASC) 10 MG tablet    Sig: Take 0.5 tablets (5 mg total) by mouth daily.    Dispense:  30 tablet    Refill:  2    Order Specific Question:   Supervising Provider    Answer:   Tresa Garter [id number]    Follow up: Return in about 2 weeks (around 12/23/2016) for Blood pressure check with clinical pharmacist.   Fredia Beets, FNP   `

## 2016-12-09 NOTE — Progress Notes (Signed)
Patient is here for physical  Patient has not taking his BP med for today  Patient ha snot eaten for today  Patient denies pain for today

## 2016-12-09 NOTE — Patient Instructions (Addendum)
Schedule blood pressure check with clinical pharmacist in 2 weeks. Take BP 3 times a week and start keeping a log of blood pressures to bring to next visit.   Hypertension Hypertension, commonly called high blood pressure, is when the force of blood pumping through the arteries is too strong. The arteries are the blood vessels that carry blood from the heart throughout the body. Hypertension forces the heart to work harder to pump blood and may cause arteries to become narrow or stiff. Having untreated or uncontrolled hypertension can cause heart attacks, strokes, kidney disease, and other problems. A blood pressure reading consists of a higher number over a lower number. Ideally, your blood pressure should be below 120/80. The first ("top") number is called the systolic pressure. It is a measure of the pressure in your arteries as your heart beats. The second ("bottom") number is called the diastolic pressure. It is a measure of the pressure in your arteries as the heart relaxes. What are the causes? The cause of this condition is not known. What increases the risk? Some risk factors for high blood pressure are under your control. Others are not. Factors you can change   Smoking.  Having type 2 diabetes mellitus, high cholesterol, or both.  Not getting enough exercise or physical activity.  Being overweight.  Having too much fat, sugar, calories, or salt (sodium) in your diet.  Drinking too much alcohol. Factors that are difficult or impossible to change   Having chronic kidney disease.  Having a family history of high blood pressure.  Age. Risk increases with age.  Race. You may be at higher risk if you are African-American.  Gender. Men are at higher risk than women before age 35. After age 32, women are at higher risk than men.  Having obstructive sleep apnea.  Stress. What are the signs or symptoms? Extremely high blood pressure (hypertensive crisis) may  cause:  Headache.  Anxiety.  Shortness of breath.  Nosebleed.  Nausea and vomiting.  Severe chest pain.  Jerky movements you cannot control (seizures). How is this diagnosed? This condition is diagnosed by measuring your blood pressure while you are seated, with your arm resting on a surface. The cuff of the blood pressure monitor will be placed directly against the skin of your upper arm at the level of your heart. It should be measured at least twice using the same arm. Certain conditions can cause a difference in blood pressure between your right and left arms. Certain factors can cause blood pressure readings to be lower or higher than normal (elevated) for a short period of time:  When your blood pressure is higher when you are in a health care provider's office than when you are at home, this is called white coat hypertension. Most people with this condition do not need medicines.  When your blood pressure is higher at home than when you are in a health care provider's office, this is called masked hypertension. Most people with this condition may need medicines to control blood pressure. If you have a high blood pressure reading during one visit or you have normal blood pressure with other risk factors:  You may be asked to return on a different day to have your blood pressure checked again.  You may be asked to monitor your blood pressure at home for 1 week or longer. If you are diagnosed with hypertension, you may have other blood or imaging tests to help your health care provider understand your overall  risk for other conditions. How is this treated? This condition is treated by making healthy lifestyle changes, such as eating healthy foods, exercising more, and reducing your alcohol intake. Your health care provider may prescribe medicine if lifestyle changes are not enough to get your blood pressure under control, and if:  Your systolic blood pressure is above 130.  Your  diastolic blood pressure is above 80. Your personal target blood pressure may vary depending on your medical conditions, your age, and other factors. Follow these instructions at home: Eating and drinking   Eat a diet that is high in fiber and potassium, and low in sodium, added sugar, and fat. An example eating plan is called the DASH (Dietary Approaches to Stop Hypertension) diet. To eat this way:  Eat plenty of fresh fruits and vegetables. Try to fill half of your plate at each meal with fruits and vegetables.  Eat whole grains, such as whole wheat pasta, brown rice, or whole grain bread. Fill about one quarter of your plate with whole grains.  Eat or drink low-fat dairy products, such as skim milk or low-fat yogurt.  Avoid fatty cuts of meat, processed or cured meats, and poultry with skin. Fill about one quarter of your plate with lean proteins, such as fish, chicken without skin, beans, eggs, and tofu.  Avoid premade and processed foods. These tend to be higher in sodium, added sugar, and fat.  Reduce your daily sodium intake. Most people with hypertension should eat less than 1,500 mg of sodium a day.  Limit alcohol intake to no more than 1 drink a day for nonpregnant women and 2 drinks a day for men. One drink equals 12 oz of beer, 5 oz of wine, or 1 oz of hard liquor. Lifestyle   Work with your health care provider to maintain a healthy body weight or to lose weight. Ask what an ideal weight is for you.  Get at least 30 minutes of exercise that causes your heart to beat faster (aerobic exercise) most days of the week. Activities may include walking, swimming, or biking.  Include exercise to strengthen your muscles (resistance exercise), such as pilates or lifting weights, as part of your weekly exercise routine. Try to do these types of exercises for 30 minutes at least 3 days a week.  Do not use any products that contain nicotine or tobacco, such as cigarettes and e-cigarettes.  If you need help quitting, ask your health care provider.  Monitor your blood pressure at home as told by your health care provider.  Keep all follow-up visits as told by your health care provider. This is important. Medicines   Take over-the-counter and prescription medicines only as told by your health care provider. Follow directions carefully. Blood pressure medicines must be taken as prescribed.  Do not skip doses of blood pressure medicine. Doing this puts you at risk for problems and can make the medicine less effective.  Ask your health care provider about side effects or reactions to medicines that you should watch for. Contact a health care provider if:  You think you are having a reaction to a medicine you are taking.  You have headaches that keep coming back (recurring).  You feel dizzy.  You have swelling in your ankles.  You have trouble with your vision. Get help right away if:  You develop a severe headache or confusion.  You have unusual weakness or numbness.  You feel faint.  You have severe pain in your chest  or abdomen.  You vomit repeatedly.  You have trouble breathing. Summary  Hypertension is when the force of blood pumping through your arteries is too strong. If this condition is not controlled, it may put you at risk for serious complications.  Your personal target blood pressure may vary depending on your medical conditions, your age, and other factors. For most people, a normal blood pressure is less than 120/80.  Hypertension is treated with lifestyle changes, medicines, or a combination of both. Lifestyle changes include weight loss, eating a healthy, low-sodium diet, exercising more, and limiting alcohol. This information is not intended to replace advice given to you by your health care provider. Make sure you discuss any questions you have with your health care provider. Document Released: 08/25/2005 Document Revised: 07/23/2016 Document  Reviewed: 07/23/2016 Elsevier Interactive Patient Education  2017 Elsevier Inc.  Amlodipine tablets What is this medicine? AMLODIPINE (am LOE di peen) is a calcium-channel blocker. It affects the amount of calcium found in your heart and muscle cells. This relaxes your blood vessels, which can reduce the amount of work the heart has to do. This medicine is used to lower high blood pressure. It is also used to prevent chest pain. This medicine may be used for other purposes; ask your health care provider or pharmacist if you have questions. COMMON BRAND NAME(S): Norvasc What should I tell my health care provider before I take this medicine? They need to know if you have any of these conditions: -heart problems like heart failure or aortic stenosis -liver disease -an unusual or allergic reaction to amlodipine, other medicines, foods, dyes, or preservatives -pregnant or trying to get pregnant -breast-feeding How should I use this medicine? Take this medicine by mouth with a glass of water. Follow the directions on the prescription label. Take your medicine at regular intervals. Do not take more medicine than directed. Talk to your pediatrician regarding the use of this medicine in children. Special care may be needed. This medicine has been used in children as young as 6. Persons over 61 years old may have a stronger reaction to this medicine and need smaller doses. Overdosage: If you think you have taken too much of this medicine contact a poison control center or emergency room at once. NOTE: This medicine is only for you. Do not share this medicine with others. What if I miss a dose? If you miss a dose, take it as soon as you can. If it is almost time for your next dose, take only that dose. Do not take double or extra doses. What may interact with this medicine? -herbal or dietary supplements -local or general anesthetics -medicines for high blood pressure -medicines for prostate  problems -rifampin This list may not describe all possible interactions. Give your health care provider a list of all the medicines, herbs, non-prescription drugs, or dietary supplements you use. Also tell them if you smoke, drink alcohol, or use illegal drugs. Some items may interact with your medicine. What should I watch for while using this medicine? Visit your doctor or health care professional for regular check ups. Check your blood pressure and pulse rate regularly. Ask your health care professional what your blood pressure and pulse rate should be, and when you should contact him or her. This medicine may make you feel confused, dizzy or lightheaded. Do not drive, use machinery, or do anything that needs mental alertness until you know how this medicine affects you. To reduce the risk of dizzy or fainting  spells, do not sit or stand up quickly, especially if you are an older patient. Avoid alcoholic drinks; they can make you more dizzy. Do not suddenly stop taking amlodipine. Ask your doctor or health care professional how you can gradually reduce the dose. What side effects may I notice from receiving this medicine? Side effects that you should report to your doctor or health care professional as soon as possible: -allergic reactions like skin rash, itching or hives, swelling of the face, lips, or tongue -breathing problems -changes in vision or hearing -chest pain -fast, irregular heartbeat -swelling of legs or ankles Side effects that usually do not require medical attention (report to your doctor or health care professional if they continue or are bothersome): -dry mouth -facial flushing -nausea, vomiting -stomach gas, pain -tired, weak -trouble sleeping This list may not describe all possible side effects. Call your doctor for medical advice about side effects. You may report side effects to FDA at 1-800-FDA-1088. Where should I keep my medicine? Keep out of the reach of  children. Store at room temperature between 59 and 86 degrees F (15 and 30 degrees C). Protect from light. Keep container tightly closed. Throw away any unused medicine after the expiration date. NOTE: This sheet is a summary. It may not cover all possible information. If you have questions about this medicine, talk to your doctor, pharmacist, or health care provider.  2018 Elsevier/Gold Standard (2012-07-23 11:40:58)

## 2016-12-10 LAB — CMP14+EGFR
ALK PHOS: 52 IU/L (ref 39–117)
ALT: 15 IU/L (ref 0–44)
AST: 18 IU/L (ref 0–40)
Albumin/Globulin Ratio: 1.5 (ref 1.2–2.2)
Albumin: 4.3 g/dL (ref 3.6–4.8)
BUN/Creatinine Ratio: 16 (ref 10–24)
BUN: 20 mg/dL (ref 8–27)
Bilirubin Total: 0.5 mg/dL (ref 0.0–1.2)
CALCIUM: 8.8 mg/dL (ref 8.6–10.2)
CO2: 25 mmol/L (ref 18–29)
CREATININE: 1.22 mg/dL (ref 0.76–1.27)
Chloride: 90 mmol/L — ABNORMAL LOW (ref 96–106)
GFR calc Af Amer: 74 mL/min/{1.73_m2} (ref >59)
GFR, EST NON AFRICAN AMERICAN: 64 mL/min/{1.73_m2} (ref >59)
GLOBULIN, TOTAL: 2.8 g/dL (ref 1.5–4.5)
GLUCOSE: 108 mg/dL — AB (ref 65–99)
Potassium: 4 mmol/L (ref 3.5–5.2)
SODIUM: 130 mmol/L — AB (ref 134–144)
Total Protein: 7.1 g/dL (ref 6.0–8.5)

## 2016-12-10 LAB — PROTIME-INR
INR: 1 (ref 0.8–1.2)
PROTHROMBIN TIME: 11.1 s (ref 9.1–12.0)

## 2016-12-10 LAB — HEMOGLOBIN A1C
ESTIMATED AVERAGE GLUCOSE: 105 mg/dL
HEMOGLOBIN A1C: 5.3 % (ref 4.8–5.6)

## 2016-12-10 LAB — PSA: PROSTATE SPECIFIC AG, SERUM: 0.8 ng/mL (ref 0.0–4.0)

## 2016-12-10 LAB — VITAMIN D 25 HYDROXY (VIT D DEFICIENCY, FRACTURES): VIT D 25 HYDROXY: 8.1 ng/mL — AB (ref 30.0–100.0)

## 2016-12-10 LAB — TSH: TSH: 1.35 u[IU]/mL (ref 0.450–4.500)

## 2016-12-11 ENCOUNTER — Telehealth: Payer: Self-pay | Admitting: Family Medicine

## 2016-12-11 LAB — SPECIMEN STATUS REPORT

## 2016-12-11 NOTE — Telephone Encounter (Signed)
Patient calling needing clarification on BP medications and instructions that were given  Please f/u

## 2016-12-12 ENCOUNTER — Other Ambulatory Visit: Payer: Self-pay | Admitting: Family Medicine

## 2016-12-12 DIAGNOSIS — E871 Hypo-osmolality and hyponatremia: Secondary | ICD-10-CM

## 2016-12-12 DIAGNOSIS — E559 Vitamin D deficiency, unspecified: Secondary | ICD-10-CM

## 2016-12-12 MED ORDER — VITAMIN D (ERGOCALCIFEROL) 1.25 MG (50000 UNIT) PO CAPS: 50000.0000 [IU] | ORAL_CAPSULE | ORAL | 0 refills | Status: AC

## 2016-12-12 NOTE — Telephone Encounter (Signed)
-----   Message from Mandesia R Hairston, FNP sent at 12/12/2016  1:31 PM EDT ----- Vitamin D level was very low. Vitamin D helps to keep bones strong. You were prescribed ergocalciferol (capsules) to increase your vitamin-d level. Once finished start taking OTC vitamin d supplement with 800 international units (IU) of vitamin-d per day.  Recommend recheck in 3 months. Labs that evaluate how well your blood clots are normal. Kidney function normal Liver function normal Thyroid function normal PSA is normal. PSA is screens for prostate problems HgbA1c is normal you do not have diabetes. Sodium level decreased. Increase your salt intake, can also drink Gatorade. Avoid or greatly limit your intake of beer. Recommend recheck in 2 weeks. 

## 2016-12-12 NOTE — Telephone Encounter (Signed)
CMA did a second try to call patient   Patient receive my VM message   Patient was aware and understood

## 2016-12-12 NOTE — Telephone Encounter (Signed)
-----   Message from Lizbeth Bark, FNP sent at 12/12/2016  1:31 PM EDT ----- Vitamin D level was very low. Vitamin D helps to keep bones strong. You were prescribed ergocalciferol (capsules) to increase your vitamin-d level. Once finished start taking OTC vitamin d supplement with 800 international units (IU) of vitamin-d per day.  Recommend recheck in 3 months. Labs that evaluate how well your blood clots are normal. Kidney function normal Liver function normal Thyroid function normal PSA is normal. PSA is screens for prostate problems HgbA1c is normal you do not have diabetes. Sodium level decreased. Increase your salt intake, can also drink Gatorade. Avoid or greatly limit your intake of beer. Recommend recheck in 2 weeks.

## 2016-12-12 NOTE — Telephone Encounter (Signed)
CMA call patient about BP medication & lab results  Patient did not answer but left a detailed VM stating  the reason & how to take his BP medication   CMA also stated that & if he had any questions just to call back at the office

## 2016-12-12 NOTE — Telephone Encounter (Signed)
Patient calling needing clarification on BP medications and instructions that were given  Please advice?

## 2016-12-12 NOTE — Telephone Encounter (Signed)
Please notify patient that he should take 1/2 a tablet of the amlodipine daily a total of 5 mg and to take lisinopril 40 mg daily. Schedule BP recheck with RN in office in 2 weeks. If blood pressure is still elevated provider will increase dose of amlodipine to 1 tablet (10 mg total) daily. If he does not have a BP cuff at home he can take BP at local pharmacy and keep log to bring to office visit with the RN in 2 weeks.

## 2016-12-15 LAB — CBC WITH DIFFERENTIAL/PLATELET
BASOS: 0 %
Basophils Absolute: 0 10*3/uL (ref 0.0–0.2)
EOS (ABSOLUTE): 0.1 10*3/uL (ref 0.0–0.4)
EOS: 2 %
HEMATOCRIT: 44.6 % (ref 37.5–51.0)
Hemoglobin: 15.2 g/dL (ref 13.0–17.7)
IMMATURE GRANS (ABS): 0 10*3/uL (ref 0.0–0.1)
Immature Granulocytes: 0 %
LYMPHS: 30 %
Lymphocytes Absolute: 2 10*3/uL (ref 0.7–3.1)
MCH: 32.7 pg (ref 26.6–33.0)
MCHC: 34.1 g/dL (ref 31.5–35.7)
MCV: 96 fL (ref 79–97)
MONOCYTES: 10 %
Monocytes Absolute: 0.6 10*3/uL (ref 0.1–0.9)
NEUTROS PCT: 58 %
Neutrophils Absolute: 3.8 10*3/uL (ref 1.4–7.0)
Platelets: 225 10*3/uL (ref 150–379)
RBC: 4.65 x10E6/uL (ref 4.14–5.80)
RDW: 13.3 % (ref 12.3–15.4)
WBC: 6.5 10*3/uL (ref 3.4–10.8)

## 2016-12-15 LAB — SPECIMEN STATUS REPORT

## 2016-12-18 ENCOUNTER — Encounter: Payer: Self-pay | Admitting: Emergency Medicine

## 2016-12-18 ENCOUNTER — Ambulatory Visit (INDEPENDENT_AMBULATORY_CARE_PROVIDER_SITE_OTHER): Payer: 59 | Admitting: Emergency Medicine

## 2016-12-18 DIAGNOSIS — J301 Allergic rhinitis due to pollen: Secondary | ICD-10-CM

## 2016-12-18 DIAGNOSIS — J449 Chronic obstructive pulmonary disease, unspecified: Secondary | ICD-10-CM | POA: Diagnosis not present

## 2016-12-18 DIAGNOSIS — R918 Other nonspecific abnormal finding of lung field: Secondary | ICD-10-CM | POA: Diagnosis not present

## 2016-12-18 DIAGNOSIS — Z720 Tobacco use: Secondary | ICD-10-CM

## 2016-12-18 LAB — PULMONARY FUNCTION TEST
DL/VA % pred: 39 %
DL/VA: 1.83 ml/min/mmHg/L
DLCO COR % PRED: 35 %
DLCO UNC % PRED: 33 %
DLCO UNC: 10.97 ml/min/mmHg
DLCO cor: 11.57 ml/min/mmHg
FEF 25-75 POST: 0.93 L/s
FEF 25-75 PRE: 0.68 L/s
FEF2575-%Change-Post: 36 %
FEF2575-%Pred-Post: 32 %
FEF2575-%Pred-Pre: 23 %
FEV1-%CHANGE-POST: 14 %
FEV1-%PRED-POST: 57 %
FEV1-%Pred-Pre: 50 %
FEV1-POST: 2.04 L
FEV1-PRE: 1.79 L
FEV1FVC-%Change-Post: 8 %
FEV1FVC-%PRED-PRE: 60 %
FEV6-%CHANGE-POST: 9 %
FEV6-%PRED-POST: 88 %
FEV6-%Pred-Pre: 80 %
FEV6-POST: 3.98 L
FEV6-Pre: 3.63 L
FEV6FVC-%Change-Post: 4 %
FEV6FVC-%Pred-Post: 101 %
FEV6FVC-%Pred-Pre: 96 %
FVC-%CHANGE-POST: 4 %
FVC-%Pred-Post: 87 %
FVC-%Pred-Pre: 83 %
FVC-Post: 4.13 L
FVC-Pre: 3.94 L
PRE FEV6/FVC RATIO: 92 %
Post FEV1/FVC ratio: 49 %
Post FEV6/FVC ratio: 97 %
Pre FEV1/FVC ratio: 45 %
RV % pred: 168 %
RV: 3.85 L
TLC % PRED: 121 %
TLC: 8.47 L

## 2016-12-18 NOTE — Progress Notes (Signed)
PFT done today. 

## 2016-12-18 NOTE — Assessment & Plan Note (Signed)
Confirmed on PFT today. Discussed starting Symbicort to see if he benefits. He will call us to let us know if we should continue it. Albuterol prn. Walking oximetry today given decreased diffusion on PFT

## 2016-12-18 NOTE — Assessment & Plan Note (Signed)
Briefly discussed cessation today - plan to discuss in more detail next visit. Asked him to think about whether he wants to stop.

## 2016-12-18 NOTE — Patient Instructions (Addendum)
We will try starting symbicort 2 puffs twice a day. Rinse and gargle after using.  Take albuterol 2 puffs up to every 4 hours if needed for shortness of breath.  Continue your flonase 2 spray each nostril daily.  Follow with Dr Delton Coombes in 3 months or sooner if you have any problems.

## 2016-12-18 NOTE — Progress Notes (Signed)
Subjective:    Patient ID: Brent Holt, male    DOB: 17-Nov-1955, 61 y.o.   MRN: [id number]  HPI  61 year old man, current smoker (80 pack years) with hx HTN, gout, glaucoma, L rib fracture with a pneumothorax about 10 yrs ago. He went to the ED 09/12/16 with exertional dyspnea and a productive cough. ED notes indicate that he had wheezing on exam. He was treated with bronchodilators and did feel some improvement in his symptoms. He was treated with levofloxacin, prednisone, and given albuterol inhaler. He had never been diagnosed with COPD before and presents now for further evaluation. A Ct scan chest was done that shows emphysema, scattered pulm nodules. He heats w wood. He has some limitation on activities by his breathing.   ROV 12/18/16 -- follow up visit for hx trobacco, COPD / emphysema on imaging after recent AE-COPD. Also pulm nodules, planning for repeat Ct chest in 03/2017. Allergic rhinitis > we started flonase, improved. A lot less mucous.  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 He has not needed albuterol since last time. He does still have some exertional dyspnea. Some wheeze. Minimal cough.   Review of Systems  Constitutional: Negative.  Negative for fever and unexpected weight change.  HENT: Negative for congestion, dental problem, ear pain, nosebleeds, postnasal drip, rhinorrhea, sinus pressure, sneezing, sore throat and trouble swallowing.   Eyes: Negative.  Negative for redness and itching.  Respiratory: Positive for shortness of breath. Negative for cough, chest tightness and wheezing.   Cardiovascular: Negative.  Negative for palpitations and leg swelling.  Gastrointestinal: Negative.  Negative for nausea and vomiting.  Genitourinary: Negative.  Negative for dysuria.  Musculoskeletal: Negative.  Negative for joint swelling.  Skin: Negative.  Negative for rash.  Neurological:  Negative.  Negative for headaches.  Hematological: Bruises/bleeds easily.  Psychiatric/Behavioral: Negative.  Negative for dysphoric mood. The patient is not nervous/anxious.     Past Medical History:  Diagnosis Date  . Glaucoma   . Gout   . Hypertension      No family history on file.   Social History   Social History  . Marital status: Married    Spouse name: N/A  . Number of children: N/A  . Years of education: N/A   Occupational History  . Not on file.   Social History Main Topics  . Smoking status: Current Every Day Smoker    Packs/day: 0.50    Types: Cigarettes  . Smokeless tobacco: Never Used  . Alcohol use Yes  . Drug use: No  . Sexual activity: Not on file   Other Topics Concern  . Not on file   Social History Narrative  . 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 albuterol (PROVENTIL HFA;VENTOLIN HFA) 108 (90 Base) MCG/ACT inhaler Inhale 2 puffs into the lungs every 6 (six) hours as needed for wheezing or shortness of breath. 1 Inhaler 2  . amLODipine (NORVASC) 10 MG tablet Take 0.5 tablets (5 mg total) by mouth daily. 30 tablet 2  . atorvastatin (LIPITOR) 20 MG tablet Take 1 tablet (20 mg total) by mouth daily. 30 tablet 2  . dorzolamide-timolol (COSOPT) 22.3-6.8 MG/ML ophthalmic solution Place 1 drop into both eyes 2 (two) times daily.    Marland Kitchen  fluticasone (FLONASE) 50 MCG/ACT nasal spray Place 2 sprays into both nostrils daily. 16 g 2  . lisinopril (PRINIVIL,ZESTRIL) 40 MG tablet Take 1 tablet (40 mg total) by mouth daily. 30 tablet 2  . simethicone (MYLICON) 80 MG chewable tablet Chew 1 tablet (80 mg total) by mouth every 6 (six) hours as needed for flatulence. 30 tablet 0  . Vitamin D, Ergocalciferol, (DRISDOL) 50000 units CAPS capsule Take 1 capsule (50,000 Units total) by mouth  every 7 (seven) days. 12 capsule 0  . levofloxacin (LEVAQUIN) 500 MG tablet Take 1 tablet (500 mg total) by mouth daily. 6 tablet 0   No facility-administered medications prior to visit.         Objective:   Physical Exam Vitals:   12/18/16 1332  BP: 128/76  Pulse: 77  SpO2: 95%  Weight: 135 lb (61.2 kg)  Height:  (1.778 m)   Gen: Pleasant, well-nourished, in no distress,  normal affect  ENT: No lesions,  mouth clear,  oropharynx clear, no postnasal drip  Neck: No JVD, no TMG, no carotid bruits  Lungs: No use of accessory muscles, clear without rales or rhonchi  Cardiovascular: RRR, heart sounds normal, no murmur or gallops, no peripheral edema  Musculoskeletal: No deformities, no cyanosis or clubbing  Neuro: alert, non focal  Skin: Warm, no lesions or rashes     09/11/16 --  COMPARISON:  Chest x-ray 09/11/2016  FINDINGS: Cardiovascular: Satisfactory opacification of the pulmonary arteries to the segmental level. No evidence of pulmonary embolism. Atherosclerotic calcifications in the aorta. No aneurysm. No obvious dissection. Heart size nonenlarged. Trace pericardial effusion or thickening.  Mediastinum/Nodes: Esophagus is within normal limits. No significant mediastinal adenopathy. subcentimeter nonspecific mediastinal lymph nodes. Thyroid gland normal. Trachea is midline.  Lungs/Pleura: Severe emphysematous changes are present bilaterally. No acute consolidation or pleural effusion is visualized. There is no pneumothorax. There are several pulmonary nodules. Within the subpleural left lower lobe, there is a 5 mm nodule, series 5, image number 94. Within the superior aspect of the right lower lobe, there is a 1.5 by 0.7 cm nodule. Slightly inferior and posterior to this on series 5, image number 88 is an additional hyperdense nodule measuring 0.9 by 0.6 cm. One of the nodules is felt to correspond to the radiographic abnormality.  Upper Abdomen:  No acute abnormality is visualized. Partially visualized small stones in the mid and upper pole of the left kidney.  Musculoskeletal: No chest wall abnormality. No acute or significant osseous findings.  Review of the MIP images confirms the above findings.  IMPRESSION: 1. No CT evidence for acute pulmonary embolus or aortic dissection 2. Severe emphysematous changes bilaterally. Several bilateral pulmonary nodules are visualized, measuring up to 1.5 cm. No follow-up needed if patient is low-risk (and has no known or suspected primary neoplasm). Non-contrast chest CT can be considered in 12 months if patient is high-risk. This recommendation follows the consensus statement: Guidelines for Management of Incidental Pulmonary Nodules Detected on CT Images: From the Fleischner Society 2017; Radiology 2017; 284:228-243. 3. Left kidney stones      Assessment & Plan:  Tobacco abuse Briefly discussed cessation today - plan to discuss in more detail next visit. Asked him to think about whether he wants to stop.   Pulmonary nodules Needs repeat Ct chest in July 2018  Allergic rhinitis Continue flonase  COPD (chronic obstructive pulmonary disease) (HCC) Confirmed on PFT today. Discussed starting Symbicort to see if he benefits. He will call us to  let us know if we should continue it. Albuterol prn. Walking oximetry today given decreased diffusion on PFT  Levy Pupa, MD, PhD 12/18/2016, 1:56 PM [facility] [phone] or if no answer [phone]

## 2016-12-18 NOTE — Assessment & Plan Note (Signed)
Continue flonase 

## 2016-12-18 NOTE — Assessment & Plan Note (Signed)
Needs repeat Ct chest in July 2018

## 2016-12-23 ENCOUNTER — Ambulatory Visit: Payer: 59 | Attending: Family Medicine | Admitting: Pharmacist

## 2016-12-23 DIAGNOSIS — I10 Essential (primary) hypertension: Secondary | ICD-10-CM

## 2016-12-23 MED ORDER — AMLODIPINE BESYLATE 10 MG PO TABS: 10.0000 mg | ORAL_TABLET | Freq: Every day | ORAL | 2 refills | Status: DC

## 2016-12-23 NOTE — Progress Notes (Signed)
S:    Patient arrives in good spirits.  Presents to the clinic for hypertension evaluation. Patient was referred on 12/09/16 by Arrie Senate.  Patient was last seen by Primary Care Provider on 12/09/16.    Patient reports adherence with medications. Current BP Medications include:  amlodipine 10 mg - take 0.5 tablet (5 mg) daily, lisinopril 40 mg daily, hydrochlorothiazide 25 mg daily  Patient denies any dizziness or orthostatic hypotension since addition of amlodipine and increase of lisinopril.  Patient reports smoking <1 pk per day and has discussed benefits of quitting with pulmonologist.  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   O:  Last 3 Office BP readings: BP Readings from Last 3 Encounters: 12/23/16: (!)168/84 (pulse 64) 12/18/16: 128/76 12/09/16: (!)154/101   A/P: HTN is uncontrolled on current medications. Discontinue hydrochlorothiazide 25 mg due to history of gout. Increase amlodipine to 10 mg (1 full tablet) daily. Continue lisinopril 40 mg daily. Encouraged patient to take all of his medications as prescribed and to not miss any doses. Patient verbalized understanding. Encouraged patient to pick up a blood pressure machine from [employer] to monitor at home.   Results reviewed and written information provided. Total time in face-to-face counseling 40 minutes.   F/U Clinic Visit with Viann Fish in 2 weeks.  Patient seen with Cheral Bay, PharmD Candidate

## 2016-12-23 NOTE — Patient Instructions (Addendum)
Thanks for coming to see [place]!  Increase amlodipine to 10 mg daily.  Stop hydrochlorothiazide - it can increase your risk of another gout attack

## 2017-01-06 ENCOUNTER — Telehealth: Payer: Self-pay | Admitting: Emergency Medicine

## 2017-01-06 NOTE — Telephone Encounter (Signed)
160 thanks.

## 2017-01-06 NOTE — Telephone Encounter (Signed)
RB was the pt to take the symbicort 160 or 80?  Please advise. Thanks

## 2017-01-07 ENCOUNTER — Ambulatory Visit (HOSPITAL_BASED_OUTPATIENT_CLINIC_OR_DEPARTMENT_OTHER): Payer: 59 | Admitting: Family Medicine

## 2017-01-07 ENCOUNTER — Ambulatory Visit: Payer: 59 | Attending: Family Medicine | Admitting: Pharmacist

## 2017-01-07 ENCOUNTER — Encounter: Payer: Self-pay | Admitting: Family Medicine

## 2017-01-07 VITALS — BP 134/75 | HR 65 | Temp 97.3°F | Resp 18 | Ht 70.0 in | Wt 137.4 lb

## 2017-01-07 DIAGNOSIS — H409 Unspecified glaucoma: Secondary | ICD-10-CM | POA: Insufficient documentation

## 2017-01-07 DIAGNOSIS — I10 Essential (primary) hypertension: Secondary | ICD-10-CM | POA: Diagnosis not present

## 2017-01-07 DIAGNOSIS — L239 Allergic contact dermatitis, unspecified cause: Secondary | ICD-10-CM | POA: Diagnosis not present

## 2017-01-07 DIAGNOSIS — H578 Other specified disorders of eye and adnexa: Secondary | ICD-10-CM | POA: Diagnosis present

## 2017-01-07 DIAGNOSIS — H109 Unspecified conjunctivitis: Secondary | ICD-10-CM | POA: Diagnosis not present

## 2017-01-07 DIAGNOSIS — H1089 Other conjunctivitis: Secondary | ICD-10-CM | POA: Diagnosis not present

## 2017-01-07 DIAGNOSIS — B9689 Other specified bacterial agents as the cause of diseases classified elsewhere: Secondary | ICD-10-CM

## 2017-01-07 DIAGNOSIS — F17210 Nicotine dependence, cigarettes, uncomplicated: Secondary | ICD-10-CM | POA: Insufficient documentation

## 2017-01-07 DIAGNOSIS — Z0000 Encounter for general adult medical examination without abnormal findings: Secondary | ICD-10-CM

## 2017-01-07 MED ORDER — METOPROLOL TARTRATE 25 MG PO TABS: 25.0000 mg | ORAL_TABLET | Freq: Every day | ORAL | 0 refills | Status: DC

## 2017-01-07 MED ORDER — FEXOFENADINE HCL 180 MG PO TABS: 180.0000 mg | ORAL_TABLET | Freq: Every day | ORAL | Status: DC

## 2017-01-07 MED ORDER — POLYMYXIN B-TRIMETHOPRIM 10000-0.1 UNIT/ML-% OP SOLN: 1.0000 [drp] | OPHTHALMIC | 0 refills | Status: AC

## 2017-01-07 MED ORDER — FAMOTIDINE 20 MG PO TABS: 20.0000 mg | ORAL_TABLET | Freq: Every day | ORAL | Status: DC

## 2017-01-07 MED ORDER — DIPHENHYDRAMINE HCL 25 MG PO CAPS: 25.0000 mg | ORAL_CAPSULE | Freq: Four times a day (QID) | ORAL | 0 refills | Status: DC | PRN

## 2017-01-07 MED ORDER — BUDESONIDE-FORMOTEROL FUMARATE 160-4.5 MCG/ACT IN AERO: 2.0000 | INHALATION_SPRAY | Freq: Two times a day (BID) | RESPIRATORY_TRACT | 5 refills | Status: DC

## 2017-01-07 MED ORDER — BACITRACIN-NEOMYCIN-POLYMYXIN 400-5-5000 EX OINT: 1.0000 "application " | TOPICAL_OINTMENT | Freq: Two times a day (BID) | CUTANEOUS | 0 refills | Status: DC | PRN

## 2017-01-07 NOTE — Telephone Encounter (Signed)
Patient states he would need the Symbicort of 160, advised we had no samples, patient request prescription sent to [facility] on [address].. Patient contact # [phone].Marland KitchenCharm Rings

## 2017-01-07 NOTE — Progress Notes (Signed)
Patient is here for allergies eyes

## 2017-01-07 NOTE — Progress Notes (Signed)
Subjective:  Patient ID: Brent Holt, male    DOB: January 02, 1956  Age: 61 y.o. MRN: [id number]  CC: No chief complaint on file.   HPI Brent Holt presents for   Eyes complaint: Reports 5 day history of eye irritation, eye redness, and eye drainage. He denies any vision changes. Denies any contact use. History of glaucoma. He does reports using new soap.  Associated symptoms include itching eyes and rhinorrhea. Eye drainage in the am with white, crusting.   HTN: Reports adherence with medications for blood pressure. Denies any CP,SOB, or swelling of BLE.  Current smoker reports smoking 1 pack a day, down from 2 packs per day. 41 year smoking history.  He is not ready to quit at this time.   Outpatient Medications Prior to Visit  Medication Sig Dispense Refill  . albuterol (PROVENTIL HFA;VENTOLIN HFA) 108 (90 Base) MCG/ACT inhaler Inhale 2 puffs into the lungs every 6 (six) hours as needed for wheezing or shortness of breath. 1 Inhaler 2  . amLODipine (NORVASC) 10 MG tablet Take 1 tablet (10 mg total) by mouth daily. 30 tablet 2  . atorvastatin (LIPITOR) 20 MG tablet Take 1 tablet (20 mg total) by mouth daily. 30 tablet 2  . budesonide-formoterol (SYMBICORT) 160-4.5 MCG/ACT inhaler Inhale 2 puffs into the lungs 2 (two) times daily. 1 Inhaler 5  . dorzolamide-timolol (COSOPT) 22.3-6.8 MG/ML ophthalmic solution Place 1 drop into both eyes 2 (two) times daily.    . fluticasone (FLONASE) 50 MCG/ACT nasal spray Place 2 sprays into both nostrils daily. 16 g 2  . lisinopril (PRINIVIL,ZESTRIL) 40 MG tablet Take 1 tablet (40 mg total) by mouth daily. 30 tablet 2  . simethicone (MYLICON) 80 MG chewable tablet Chew 1 tablet (80 mg total) by mouth every 6 (six) hours as needed for flatulence. 30 tablet 0  . Vitamin D, Ergocalciferol, (DRISDOL) 50000 units CAPS capsule Take 1 capsule (50,000 Units total) by mouth every 7 (seven) days. 12 capsule 0   No facility-administered medications prior to visit.      ROS Review of Systems  Constitutional: Negative.   HENT: Positive for rhinorrhea.   Eyes: Positive for discharge, redness and itching. Negative for visual disturbance.  Respiratory: Negative.   Cardiovascular: Negative.   Gastrointestinal: Negative.    Objective:  BP 134/75 (BP Location: Left Arm, Patient Position: Sitting, Cuff Size: Normal)   Pulse 65   Temp 97.3 F (36.3 C) (Oral)   Resp 18   Ht  (1.778 m)   Wt 137 lb 6.4 oz (62.3 kg)   SpO2 98%   BMI 19.71 kg/m   BP/Weight 01/07/2017 12/23/2016 12/18/2016  Systolic BP 134 168 128  Diastolic BP 75 84 76  Wt. (Lbs) 137.4 - 135  BMI 19.71 - 19.37   Physical Exam  HENT:  Nose: Rhinorrhea (white mucoid discharge) present.  Eyes: EOM are normal. Pupils are equal, round, and reactive to light. Left eye exhibits discharge (white, mucoid). Right conjunctiva is injected. Left conjunctiva is injected.  Cardiovascular: Normal rate, regular rhythm, normal heart sounds and intact distal pulses.   Pulmonary/Chest: Effort normal and breath sounds normal.  Abdominal: Soft. Bowel sounds are normal.  Skin: Skin is warm and dry. There is erythema (redness with skin inflammation to bilateral undereyes.).  Nursing note and vitals reviewed.  Assessment & Plan:   Problem List Items Addressed This Visit    None    Visit Diagnoses    Bacterial conjunctivitis of both eyes    -  Primary   Vision acuity screen   Relevant Medications   trimethoprim-polymyxin b (POLYTRIM) ophthalmic solution   neomycin-bacitracin-polymyxin (NEOSPORIN) ointment   Allergic contact dermatitis, unspecified trigger       Relevant Medications   fexofenadine (ALLEGRA ALLERGY) 180 MG tablet   diphenhydrAMINE (BENADRYL) 25 mg capsule   famotidine (PEPCID) 20 MG tablet   Essential hypertension       BP found to be elevated at office visits. Will add metoprolol for better BP control.   Follow up in 2 weeks with clinical pharmacist for BP check.   Follow up  in 3 months with PCP.   Relevant Medications   metoprolol tartrate (LOPRESSOR) 25 MG tablet      Meds ordered this encounter  Medications  . fexofenadine (ALLEGRA ALLERGY) 180 MG tablet    Sig: Take 1 tablet (180 mg total) by mouth daily.    Order Specific Question:   Supervising Provider    Answer:   Quentin Angst [id number]  . trimethoprim-polymyxin b (POLYTRIM) ophthalmic solution    Sig: Place 1 drop into both eyes every 3 (three) hours while awake. For 10 days.    Dispense:  10 mL    Refill:  0    Order Specific Question:   Supervising Provider    Answer:   Quentin Angst [id number]  . diphenhydrAMINE (BENADRYL) 25 mg capsule    Sig: Take 1 capsule (25 mg total) by mouth every 6 (six) hours as needed for itching or allergies. For 7 days.    Dispense:  30 capsule    Refill:  0    Order Specific Question:   Supervising Provider    Answer:   Quentin Angst [id number]  . famotidine (PEPCID) 20 MG tablet    Sig: Take 1 tablet (20 mg total) by mouth daily. For 7 days.    Order Specific Question:   Supervising Provider    Answer:   Quentin Angst [id number]  . neomycin-bacitracin-polymyxin (NEOSPORIN) ointment    Sig: Apply 1 application topically 2 (two) times daily as needed for wound care. Apply to affected areas underneath the eye.    Dispense:  15 g    Refill:  0    Order Specific Question:   Supervising Provider    Answer:   Quentin Angst [id number]  . metoprolol tartrate (LOPRESSOR) 25 MG tablet    Sig: Take 1 tablet (25 mg total) by mouth daily.    Dispense:  90 tablet    Refill:  0    Order Specific Question:   Supervising Provider    Answer:   Quentin Angst [id number]    Follow-up: Return in about 2 weeks (around 01/21/2017), or if symptoms worsen or fail to improve, for BP check with clinical pharmacist.  Lizbeth Bark FNP

## 2017-01-07 NOTE — Patient Instructions (Addendum)
Start allegra after course of benadyrl and pepcid are complete.  Bacterial Conjunctivitis Bacterial conjunctivitis is an infection of your conjunctiva. This is the clear membrane that covers the white part of your eye and the inner surface of your eyelid. This condition can make your eye:  Red or pink.  Itchy. This condition is caused by bacteria. This condition spreads very easily from person to person (is contagious) and from one eye to the other eye. Follow these instructions at home: Medicines   Take or apply your antibiotic medicine as told by your doctor. Do not stop taking or applying the antibiotic even if you start to feel better.  Take or apply over-the-counter and prescription medicines only as told by your doctor.  Do not touch your eyelid with the eye drop bottle or the ointment tube. Managing discomfort   Wipe any fluid from your eye with a warm, wet washcloth or a cotton ball.  Place a cool, clean washcloth on your eye. Do this for 10-20 minutes, 3-4 times per day. General instructions   Do not wear contact lenses until the irritation is gone. Wear glasses until your doctor says it is okay to wear contacts.  Do not wear eye makeup until your symptoms are gone. Throw away any old makeup.  Change or wash your pillowcase every day.  Do not share towels or washcloths with anyone.  Wash your hands often with soap and water. Use paper towels to dry your hands.  Do not touch or rub your eyes.  Do not drive or use heavy machinery if your vision is blurry. Contact a doctor if:  You have a fever.  Your symptoms do not get better after 10 days. Get help right away if:  You have a fever and your symptoms suddenly get worse.  You have very bad pain when you move your eye.  Your face:  Hurts.  Is red.  Is swollen.  You have sudden loss of vision. This information is not intended to replace advice given to you by your health care provider. Make sure you  discuss any questions you have with your health care provider. Document Released: 06/03/2008 Document Revised: 01/31/2016 Document Reviewed: 06/07/2015 Elsevier Interactive Patient Education  2017 Elsevier Inc.  Metoprolol tablets What is this medicine? METOPROLOL (me TOE proe lole) is a beta-blocker. Beta-blockers reduce the workload on the heart and help it to beat more regularly. This medicine is used to treat high blood pressure and to prevent chest pain. It is also used to after a heart attack and to prevent an additional heart attack from occurring. This medicine may be used for other purposes; ask your health care provider or pharmacist if you have questions. COMMON BRAND NAME(S): Lopressor What should I tell my health care provider before I take this medicine? They need to know if you have any of these conditions: -diabetes -heart or vessel disease like slow heart rate, worsening heart failure, heart block, sick sinus syndrome or Raynaud's disease -kidney disease -liver disease -lung or breathing disease, like asthma or emphysema -pheochromocytoma -thyroid disease -an unusual or allergic reaction to metoprolol, other beta-blockers, medicines, foods, dyes, or preservatives -pregnant or trying to get pregnant -breast-feeding How should I use this medicine? Take this medicine by mouth with a drink of water. Follow the directions on the prescription label. Take this medicine immediately after meals. Take your doses at regular intervals. Do not take more medicine than directed. Do not stop taking this medicine suddenly. This could  lead to serious heart-related effects. Talk to your pediatrician regarding the use of this medicine in children. Special care may be needed. Overdosage: If you think you have taken too much of this medicine contact a poison control center or emergency room at once. NOTE: This medicine is only for you. Do not share this medicine with others. What if I miss a  dose? If you miss a dose, take it as soon as you can. If it is almost time for your next dose, take only that dose. Do not take double or extra doses. What may interact with this medicine? This medicine may interact with the following medications: -certain medicines for blood pressure, heart disease, irregular heart beat -certain medicines for depression like monoamine oxidase (MAO) inhibitors, fluoxetine, or paroxetine -clonidine -dobutamine -epinephrine -isoproterenol -reserpine This list may not describe all possible interactions. Give your health care provider a list of all the medicines, herbs, non-prescription drugs, or dietary supplements you use. Also tell them if you smoke, drink alcohol, or use illegal drugs. Some items may interact with your medicine. What should I watch for while using this medicine? Visit your doctor or health care professional for regular check ups. Contact your doctor right away if your symptoms worsen. Check your blood pressure and pulse rate regularly. Ask your health care professional what your blood pressure and pulse rate should be, and when you should contact them. You may get drowsy or dizzy. Do not drive, use machinery, or do anything that needs mental alertness until you know how this medicine affects you. Do not sit or stand up quickly, especially if you are an older patient. This reduces the risk of dizzy or fainting spells. Contact your doctor if these symptoms continue. Alcohol may interfere with the effect of this medicine. Avoid alcoholic drinks. What side effects may I notice from receiving this medicine? Side effects that you should report to your doctor or health care professional as soon as possible: -allergic reactions like skin rash, itching or hives -cold or numb hands or feet -depression -difficulty breathing -faint -fever with sore throat -irregular heartbeat, chest pain -rapid weight gain -swollen legs or ankles Side effects that  usually do not require medical attention (report to your doctor or health care professional if they continue or are bothersome): -anxiety or nervousness -change in sex drive or performance -dry skin -headache -nightmares or trouble sleeping -short term memory loss -stomach upset or diarrhea -unusually tired This list may not describe all possible side effects. Call your doctor for medical advice about side effects. You may report side effects to FDA at 1-800-FDA-1088. Where should I keep my medicine? Keep out of the reach of children. Store at room temperature between 15 and 30 degrees C (59 and 86 degrees F). Throw away any unused medicine after the expiration date. NOTE: This sheet is a summary. It may not cover all possible information. If you have questions about this medicine, talk to your doctor, pharmacist, or health care provider.  2018 Elsevier/Gold Standard (2013-04-29 14:40:36)

## 2017-01-07 NOTE — Telephone Encounter (Signed)
No samples of the 160  LMTCB

## 2017-01-07 NOTE — Progress Notes (Signed)
Patient arrived for blood pressure monitoring. He had a rash around his eyes, concerning for possible allergic reaction or infection. Patient worked in to see Arrie Senate, NP, today for further management.

## 2017-01-07 NOTE — Telephone Encounter (Signed)
Rx was sent  Left detailed msg letting him know this was sent

## 2017-01-08 ENCOUNTER — Ambulatory Visit: Payer: 59 | Admitting: Emergency Medicine

## 2017-01-13 ENCOUNTER — Telehealth: Payer: Self-pay | Admitting: Family Medicine

## 2017-01-13 NOTE — Telephone Encounter (Signed)
PT called to request a refill for amLODipine (NORVASC) 10 MG tablet   since he was told the PCP told him to double the dose and now is short on his med, can you find and check on this and follow up with PT

## 2017-01-13 NOTE — Telephone Encounter (Signed)
PT called to request a refill for amLODipine (NORVASC) 10 MG tablet   since he was told the PCP told him to double the dose and now is short on his med. Please advice?

## 2017-01-21 ENCOUNTER — Ambulatory Visit: Payer: PRIVATE HEALTH INSURANCE | Attending: Family Medicine | Admitting: Pharmacist

## 2017-01-21 DIAGNOSIS — I10 Essential (primary) hypertension: Secondary | ICD-10-CM | POA: Insufficient documentation

## 2017-01-21 MED ORDER — METOPROLOL SUCCINATE ER 25 MG PO TB24: 25.0000 mg | ORAL_TABLET | Freq: Every day | ORAL | 0 refills | Status: DC

## 2017-01-21 MED ORDER — AMLODIPINE BESYLATE 10 MG PO TABS: 10.0000 mg | ORAL_TABLET | Freq: Every day | ORAL | 0 refills | Status: DC

## 2017-01-21 NOTE — Progress Notes (Signed)
Patient arrives in good spirits.  He presents to the clinic for hypertension evaluation.   He reports that he has not been able to take his amlodipine 10 mg for ~10 days due to trouble with insurance covering the dose change.     Patient reports adherence with all other medications. He took his medications, except for the amlodipine, this morning.   Current BP Medications include:  Amlodipine 10 mg once daily, Lisinopril 40 mg once daily, and metoprolol tartrate 25 mg once daily.   Antihypertensives tried in the past include: Hydrochlorothiazide (stopped due to gout)  Reports that he does not check his blood pressure at home but that he is going to try to buy an at home cuff when he is able to financially.    O:   Last 3 Office BP readings: BP Readings from Last 3 Encounters:  01/21/17 (!) 155/88  01/07/17 134/75  12/23/16 (!) 168/84    BMET    Component Value Date/Time   NA 130 (L) 12/09/2016 0943   K 4.0 12/09/2016 0943   CL 90 (L) 12/09/2016 0943   CO2 25 12/09/2016 0943   GLUCOSE 108 (H) 12/09/2016 0943   GLUCOSE 85 11/24/2016 1445   BUN 20 12/09/2016 0943   CREATININE 1.22 12/09/2016 0943   CREATININE 0.95 11/24/2016 1445   CALCIUM 8.8 12/09/2016 0943   GFRNONAA 64 12/09/2016 0943   GFRNONAA 86 11/24/2016 1445   GFRAA 74 12/09/2016 0943   GFRAA >89 11/24/2016 1445    A/P:  Longstanding hypertension which is currently not controlled on current medications.  Has not been able to take amlodipine as prescribed for over a week and is on metoprolol tartrate once daily, which may not be covering him for the whole day.   Sent new rx for amlodipine 10 mg once daily to pharmacy. Told patient to pick it up today.   Discontinue metoprolol tartrate 25 mg po once daily. Start metoprolol succinate 25 mg po once daily.   When financially able, pick up a blood pressure monitor to monitor blood pressure at home and keep a log of blood pressures.   Encouraged to eat a low-salt  diet.   Follow up in 1 week with clinical pharmacist for BP check.     Results reviewed and written information provided.  F/U w/ Viann FishStacey Hammer, PharmD in 1 week. Total time in face-to-face counseling 25 minutes.  Patient seen with Susy FrizzleNicole Basya Casavant, PharmD Candidate.

## 2017-01-21 NOTE — Patient Instructions (Addendum)
Thanks for coming to see Brent Holt  Please get the amlodipine and metoprolol  Come back in 1 week   DASH Eating Plan DASH stands for "Dietary Approaches to Stop Hypertension." The DASH eating plan is a healthy eating plan that has been shown to reduce high blood pressure (hypertension). It may also reduce your risk for type 2 diabetes, heart disease, and stroke. The DASH eating plan may also help with weight loss. What are tips for following this plan? General guidelines   Avoid eating more than 2,300 mg (milligrams) of salt (sodium) a day. If you have hypertension, you may need to reduce your sodium intake to 1,500 mg a day.  Limit alcohol intake to no more than 1 drink a day for nonpregnant women and 2 drinks a day for men. One drink equals 12 oz of beer, 5 oz of wine, or 1 oz of hard liquor.  Work with your health care provider to maintain a healthy body weight or to lose weight. Ask what an ideal weight is for you.  Get at least 30 minutes of exercise that causes your heart to beat faster (aerobic exercise) most days of the week. Activities may include walking, swimming, or biking.  Work with your health care provider or diet and nutrition specialist (dietitian) to adjust your eating plan to your individual calorie needs. Reading food labels   Check food labels for the amount of sodium per serving. Choose foods with less than 5 percent of the Daily Value of sodium. Generally, foods with less than 300 mg of sodium per serving fit into this eating plan.  To find whole grains, look for the word "whole" as the first word in the ingredient list. Shopping   Buy products labeled as "low-sodium" or "no salt added."  Buy fresh foods. Avoid canned foods and premade or frozen meals. Cooking   Avoid adding salt when cooking. Use salt-free seasonings or herbs instead of table salt or sea salt. Check with your health care provider or pharmacist before using salt substitutes.  Do not fry foods.  Cook foods using healthy methods such as baking, boiling, grilling, and broiling instead.  Cook with heart-healthy oils, such as olive, canola, soybean, or sunflower oil. Meal planning    Eat a balanced diet that includes:  5 or more servings of fruits and vegetables each day. At each meal, try to fill half of your plate with fruits and vegetables.  Up to 6-8 servings of whole grains each day.  Less than 6 oz of lean meat, poultry, or fish each day. A 3-oz serving of meat is about the same size as a deck of cards. One egg equals 1 oz.  2 servings of low-fat dairy each day.  A serving of nuts, seeds, or beans 5 times each week.  Heart-healthy fats. Healthy fats called Omega-3 fatty acids are found in foods such as flaxseeds and coldwater fish, like sardines, salmon, and mackerel.  Limit how much you eat of the following:  Canned or prepackaged foods.  Food that is high in trans fat, such as fried foods.  Food that is high in saturated fat, such as fatty meat.  Sweets, desserts, sugary drinks, and other foods with added sugar.  Full-fat dairy products.  Do not salt foods before eating.  Try to eat at least 2 vegetarian meals each week.  Eat more home-cooked food and less restaurant, buffet, and fast food.  When eating at a restaurant, ask that your food be prepared with  less salt or no salt, if possible. What foods are recommended? The items listed may not be a complete list. Talk with your dietitian about what dietary choices are best for you. Grains  Whole-grain or whole-wheat bread. Whole-grain or whole-wheat pasta. Brown rice. [place]row. Bulgur. Whole-grain and low-sodium cereals. Pita bread. Low-fat, low-sodium crackers. Whole-wheat flour tortillas. Vegetables  Fresh or frozen vegetables (raw, steamed, roasted, or grilled). Low-sodium or reduced-sodium tomato and vegetable juice. Low-sodium or reduced-sodium tomato sauce and tomato paste. Low-sodium or  reduced-sodium canned vegetables. Fruits  All fresh, dried, or frozen fruit. Canned fruit in natural juice (without added sugar). Meat and other protein foods  Skinless chicken or [place]. Ground chicken or [place]. Pork with fat trimmed off. Fish and seafood. Egg whites. Dried beans, peas, or lentils. Unsalted nuts, nut butters, and seeds. Unsalted canned beans. Lean cuts of beef with fat trimmed off. Low-sodium, lean deli meat. Dairy  Low-fat (1%) or fat-free (skim) milk. Fat-free, low-fat, or reduced-fat cheeses. Nonfat, low-sodium ricotta or cottage cheese. Low-fat or nonfat yogurt. Low-fat, low-sodium cheese. Fats and oils  Soft margarine without trans fats. Vegetable oil. Low-fat, reduced-fat, or light mayonnaise and salad dressings (reduced-sodium). Canola, safflower, olive, soybean, and sunflower oils. Avocado. Seasoning and other foods  Herbs. Spices. Seasoning mixes without salt. Unsalted popcorn and pretzels. Fat-free sweets. What foods are not recommended? The items listed may not be a complete list. Talk with your dietitian about what dietary choices are best for you. Grains  Baked goods made with fat, such as croissants, muffins, or some breads. Dry pasta or rice meal packs. Vegetables  Creamed or fried vegetables. Vegetables in a cheese sauce. Regular canned vegetables (not low-sodium or reduced-sodium). Regular canned tomato sauce and paste (not low-sodium or reduced-sodium). Regular tomato and vegetable juice (not low-sodium or reduced-sodium). Angie Fava. Olives. Fruits  Canned fruit in a light or heavy syrup. Fried fruit. Fruit in cream or butter sauce. Meat and other protein foods  Fatty cuts of meat. Ribs. Fried meat. Berniece Salines. Sausage. Bologna and other processed lunch meats. Salami. Fatback. Hotdogs. Bratwurst. Salted nuts and seeds. Canned beans with added salt. Canned or smoked fish. Whole eggs or egg yolks. Chicken or [place] with skin. Dairy  Whole or 2% milk, cream, and  half-and-half. Whole or full-fat cream cheese. Whole-fat or sweetened yogurt. Full-fat cheese. Nondairy creamers. Whipped toppings. Processed cheese and cheese spreads. Fats and oils  Butter. Stick margarine. Lard. Shortening. Ghee. Bacon fat. Tropical oils, such as coconut, palm kernel, or palm oil. Seasoning and other foods  Salted popcorn and pretzels. Onion salt, garlic salt, seasoned salt, table salt, and sea salt. Worcestershire sauce. Tartar sauce. Barbecue sauce. Teriyaki sauce. Soy sauce, including reduced-sodium. Steak sauce. Canned and packaged gravies. Fish sauce. Oyster sauce. Cocktail sauce. Horseradish that you find on the shelf. Ketchup. Mustard. Meat flavorings and tenderizers. Bouillon cubes. Hot sauce and Tabasco sauce. Premade or packaged marinades. Premade or packaged taco seasonings. Relishes. Regular salad dressings. Where to find more information:  National Heart, Lung, and [place]: https://wilson-eaton.com/  American Heart Association: www.heart.org Summary  The DASH eating plan is a healthy eating plan that has been shown to reduce high blood pressure (hypertension). It may also reduce your risk for type 2 diabetes, heart disease, and stroke.  With the DASH eating plan, you should limit salt (sodium) intake to 2,300 mg a day. If you have hypertension, you may need to reduce your sodium intake to 1,500 mg a day.  When on the DASH  eating plan, aim to eat more fresh fruits and vegetables, whole grains, lean proteins, low-fat dairy, and heart-healthy fats.  Work with your health care provider or diet and nutrition specialist (dietitian) to adjust your eating plan to your individual calorie needs. This information is not intended to replace advice given to you by your health care provider. Make sure you discuss any questions you have with your health care provider. Document Released: 08/14/2011 Document Revised: 08/18/2016 Document Reviewed: 08/18/2016 Elsevier Interactive  Patient Education  2017 [employer].

## 2017-01-29 ENCOUNTER — Encounter: Payer: 59 | Admitting: Pharmacist

## 2017-02-09 ENCOUNTER — Encounter: Payer: Self-pay | Admitting: Family Medicine

## 2017-02-13 ENCOUNTER — Other Ambulatory Visit: Payer: Self-pay | Admitting: Family Medicine

## 2017-02-13 DIAGNOSIS — E782 Mixed hyperlipidemia: Secondary | ICD-10-CM

## 2017-03-16 ENCOUNTER — Other Ambulatory Visit: Payer: Self-pay | Admitting: Family Medicine

## 2017-03-16 DIAGNOSIS — I10 Essential (primary) hypertension: Secondary | ICD-10-CM

## 2017-03-25 ENCOUNTER — Ambulatory Visit (INDEPENDENT_AMBULATORY_CARE_PROVIDER_SITE_OTHER)
Admission: RE | Admit: 2017-03-25 | Discharge: 2017-03-25 | Disposition: A | Discharge status: Home / Self Care | Payer: 59 | Source: Ambulatory Visit | Attending: Emergency Medicine | Admitting: Emergency Medicine

## 2017-03-25 DIAGNOSIS — R918 Other nonspecific abnormal finding of lung field: Secondary | ICD-10-CM | POA: Diagnosis not present

## 2017-03-31 ENCOUNTER — Encounter: Payer: Self-pay | Admitting: Emergency Medicine

## 2017-03-31 ENCOUNTER — Ambulatory Visit (INDEPENDENT_AMBULATORY_CARE_PROVIDER_SITE_OTHER): Payer: 59 | Admitting: Emergency Medicine

## 2017-03-31 DIAGNOSIS — J301 Allergic rhinitis due to pollen: Secondary | ICD-10-CM | POA: Diagnosis not present

## 2017-03-31 DIAGNOSIS — R918 Other nonspecific abnormal finding of lung field: Secondary | ICD-10-CM

## 2017-03-31 DIAGNOSIS — J431 Panlobular emphysema: Secondary | ICD-10-CM

## 2017-03-31 DIAGNOSIS — Z720 Tobacco use: Secondary | ICD-10-CM

## 2017-03-31 MED ORDER — BUDESONIDE-FORMOTEROL FUMARATE 160-4.5 MCG/ACT IN AERO: 2.0000 | INHALATION_SPRAY | Freq: Two times a day (BID) | RESPIRATORY_TRACT | 5 refills | Status: DC

## 2017-03-31 NOTE — Assessment & Plan Note (Signed)
Discussed cessation with him today. He has cut down to half a pack a day. He is interested in trying to stop altogether. He is a good question of nicotine replacement and possibly using vapor placement. I told him that I believe this is acceptable as a temporary replacement but would not want him to stay on vapor indefinitely given its own potential for harm.

## 2017-03-31 NOTE — Assessment & Plan Note (Signed)
He has had some clinical decline, worsening of his breathing since he had to stop his Symbicort. Cost is the issue. I will try to get him some financial assistance with the co-pay since he has private commercial insurance. This is usually available. He needs Symbicort and we will also need to refill his albuterol.

## 2017-03-31 NOTE — Assessment & Plan Note (Signed)
Currently off allergy regimen. He notes that he may need to restart in the fall and spring months.

## 2017-03-31 NOTE — Patient Instructions (Signed)
Restart Symbicort 160 2 puffs twice daily. Keep Albuterol to use as needed. Please work on stopping smoking. We well repeat your CT scan in July 2019. Follow up with Dr. Delton CoombesByrum in 4 months.

## 2017-03-31 NOTE — Progress Notes (Signed)
Subjective:    Patient ID: Brent Holt, male    DOB: 09/03/1956, 61 y.o.   MRN: [id number]  HPI  61 year old man, current smoker (80 pack years) with hx HTN, gout, glaucoma, L rib fracture with a pneumothorax about 10 yrs ago. He went to the ED 09/12/16 with exertional dyspnea and a productive cough. ED notes indicate that he had wheezing on exam. He was treated with bronchodilators and did feel some improvement in his symptoms. He was treated with levofloxacin, prednisone, and given albuterol inhaler. He had never been diagnosed with COPD before and presents now for further evaluation. A Ct scan chest was done that shows emphysema, scattered pulm nodules. He heats w wood. He has some limitation on activities by his breathing.   ROV 12/18/16 -- follow up visit for hx trobacco, COPD / emphysema on imaging after recent AE-COPD. Also pulm nodules, planning for repeat Ct chest in 03/2017. Allergic rhinitis > we started flonase, improved. A lot less mucous.  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 He has not needed albuterol since last time. He does still have some exertional dyspnea. Some wheeze. Minimal cough.   ROV 03/31/17 -- Patient has a history of COPD and emphysema, history tobacco use, allergic rhinitis. As above pulmonary function testing has identified severe obstruction with asthmatic features. Also with pulmonary nodules. We repeated his CT scan of the chest on 03/25/17 that I have personally reviewed. This shows stable severe bullous emphysema with some stable nodular change in the superior segment and right upper lobe. No new nodules seen. He believes that his breathing is stable. He has run out of symbicort due to cost. Notes that he misses it. He has also run out of albuterol. Did not desat last time on walk on RA. No overt flares.    Review of Systems  Constitutional: Negative.  Negative for  fever and unexpected weight change.  HENT: Negative for congestion, dental problem, ear pain, nosebleeds, postnasal drip, rhinorrhea, sinus pressure, sneezing, sore throat and trouble swallowing.   Eyes: Negative.  Negative for redness and itching.  Respiratory: Positive for shortness of breath. Negative for cough, chest tightness and wheezing.   Cardiovascular: Negative.  Negative for palpitations and leg swelling.  Gastrointestinal: Negative.  Negative for nausea and vomiting.  Genitourinary: Negative.  Negative for dysuria.  Musculoskeletal: Negative.  Negative for joint swelling.  Skin: Negative.  Negative for rash.  Neurological: Negative.  Negative for headaches.  Hematological: Bruises/bleeds easily.  Psychiatric/Behavioral: Negative.  Negative for dysphoric mood. The patient is not nervous/anxious.     Past Medical History:  Diagnosis Date  . Glaucoma   . Gout   . Hypertension      No family history on file.   Social History   Social History  . Marital status: Married    Spouse name: N/A  . Number of children: N/A  . Years of education: N/A   Occupational History  . Not on file.   Social History Main Topics  . Smoking status: Current Every Day Smoker    Packs/day: 0.50    Types: Cigarettes  . Smokeless tobacco: Never Used  . Alcohol use Yes  . Drug use: No  . Sexual activity: Not on file   Other Topics Concern  . Not on file   Social History Narrative  . 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 albuterol (PROVENTIL HFA;VENTOLIN HFA) 108 (90 Base) MCG/ACT inhaler Inhale 2 puffs into the lungs every 6 (six) hours as needed for wheezing or shortness of breath. 1 Inhaler 2  . amLODipine (NORVASC) 10 MG tablet Take 1 tablet (10 mg total) by mouth daily. 90 tablet 0  . atorvastatin  (LIPITOR) 20 MG tablet TAKE 1 TABLET BY MOUTH ONCE DAILY 90 tablet 0  . dorzolamide-timolol (COSOPT) 22.3-6.8 MG/ML ophthalmic solution Place 1 drop into both eyes 2 (two) times daily.    Marland Kitchen lisinopril (PRINIVIL,ZESTRIL) 40 MG tablet TAKE 1 TABLET BY MOUTH ONCE DAILY 30 tablet 0  . metoprolol succinate (TOPROL-XL) 25 MG 24 hr tablet Take 1 tablet (25 mg total) by mouth daily. 90 tablet 0  . budesonide-formoterol (SYMBICORT) 160-4.5 MCG/ACT inhaler Inhale 2 puffs into the lungs 2 (two) times daily. (Patient not taking: Reported on 01/21/2017) 1 Inhaler 5  . diphenhydrAMINE (BENADRYL) 25 mg capsule Take 1 capsule (25 mg total) by mouth every 6 (six) hours as needed for itching or allergies. For 7 days. (Patient not taking: Reported on 01/21/2017) 30 capsule 0  . famotidine (PEPCID) 20 MG tablet Take 1 tablet (20 mg total) by mouth daily. For 7 days. (Patient not taking: Reported on 01/21/2017)    . fexofenadine (ALLEGRA ALLERGY) 180 MG tablet Take 1 tablet (180 mg total) by mouth daily. (Patient not taking: Reported on 01/21/2017)    . fluticasone (FLONASE) 50 MCG/ACT nasal spray Place 2 sprays into both nostrils daily. (Patient not taking: Reported on 01/21/2017) 16 g 2  . neomycin-bacitracin-polymyxin (NEOSPORIN) ointment Apply 1 application topically 2 (two) times daily as needed for wound care. Apply to affected areas underneath the eye. (Patient not taking: Reported on 01/21/2017) 15 g 0  . simethicone (MYLICON) 80 MG chewable tablet Chew 1 tablet (80 mg total) by mouth every 6 (six) hours as needed for flatulence. (Patient not taking: Reported on 01/21/2017) 30 tablet 0   No facility-administered medications prior to visit.         Objective:   Physical Exam Vitals:   03/31/17 0912 03/31/17 0913  BP:  (!) 138/96  Pulse:  60  SpO2:  99%  Weight: 133 lb (60.3 kg)   Height: 5\' 10"  (1.778 m)    Gen: Pleasant, well-nourished, in no distress,  normal affect  ENT: No lesions,  mouth clear,   oropharynx clear, no postnasal drip  Neck: No JVD, no TMG, no carotid bruits  Lungs: No use of accessory muscles, clear without rales or rhonchi  Cardiovascular: RRR, heart sounds normal, no murmur or gallops, no peripheral edema  Musculoskeletal: No deformities, no cyanosis or clubbing  Neuro: alert, non focal  Skin: Warm, no lesions or rashes      Assessment & Plan:  Tobacco abuse Discussed cessation with him today. He has cut down to half a pack a day. He is interested in trying to stop altogether. He is a good question of nicotine replacement and possibly using vapor placement. I told him that I believe this is acceptable as a temporary replacement but would not want him to stay on vapor indefinitely given its own potential for harm.  Pulmonary nodules His right upper lobe nodules are stable on follow-up CT scan 7/18. He needs a repeat scan July 2019 without contrast.  Allergic rhinitis Currently off allergy regimen. He notes  that he may need to restart in the fall and spring months.  COPD (chronic obstructive pulmonary disease) (HCC) He has had some clinical decline, worsening of his breathing since he had to stop his Symbicort. Cost is the issue. I will try to get him some financial assistance with the co-pay since he has private commercial insurance. This is usually available. He needs Symbicort and we will also need to refill his albuterol.  Levy Pupaobert Evangaline Jou, MD, PhD 03/31/2017, 9:41 AM [facility] [phone] or if no answer [phone]

## 2017-03-31 NOTE — Assessment & Plan Note (Signed)
His right upper lobe nodules are stable on follow-up CT scan 7/18. He needs a repeat scan July 2019 without contrast.

## 2017-04-13 ENCOUNTER — Encounter: Payer: Self-pay | Admitting: Family Medicine

## 2017-04-13 ENCOUNTER — Ambulatory Visit: Payer: 59 | Attending: Family Medicine | Admitting: Family Medicine

## 2017-04-13 VITALS — BP 95/59 | HR 82 | Temp 98.1°F | Resp 18 | Ht 70.0 in | Wt 132.0 lb

## 2017-04-13 DIAGNOSIS — J449 Chronic obstructive pulmonary disease, unspecified: Secondary | ICD-10-CM | POA: Insufficient documentation

## 2017-04-13 DIAGNOSIS — J431 Panlobular emphysema: Secondary | ICD-10-CM | POA: Diagnosis not present

## 2017-04-13 DIAGNOSIS — E785 Hyperlipidemia, unspecified: Secondary | ICD-10-CM | POA: Diagnosis not present

## 2017-04-13 DIAGNOSIS — Z760 Encounter for issue of repeat prescription: Secondary | ICD-10-CM

## 2017-04-13 DIAGNOSIS — Z09 Encounter for follow-up examination after completed treatment for conditions other than malignant neoplasm: Secondary | ICD-10-CM | POA: Diagnosis not present

## 2017-04-13 DIAGNOSIS — Z8639 Personal history of other endocrine, nutritional and metabolic disease: Secondary | ICD-10-CM | POA: Diagnosis not present

## 2017-04-13 DIAGNOSIS — R42 Dizziness and giddiness: Secondary | ICD-10-CM

## 2017-04-13 DIAGNOSIS — I10 Essential (primary) hypertension: Secondary | ICD-10-CM

## 2017-04-13 MED ORDER — AMLODIPINE BESYLATE 5 MG PO TABS: 5.0000 mg | ORAL_TABLET | Freq: Every day | ORAL | 3 refills | Status: DC

## 2017-04-13 MED ORDER — ALBUTEROL SULFATE HFA 108 (90 BASE) MCG/ACT IN AERS: 2.0000 | INHALATION_SPRAY | Freq: Four times a day (QID) | RESPIRATORY_TRACT | 2 refills | Status: DC | PRN

## 2017-04-13 MED ORDER — LISINOPRIL 40 MG PO TABS: 40.0000 mg | ORAL_TABLET | Freq: Every day | ORAL | 3 refills | Status: DC

## 2017-04-13 MED ORDER — METOPROLOL SUCCINATE ER 25 MG PO TB24: 25.0000 mg | ORAL_TABLET | Freq: Every day | ORAL | 3 refills | Status: DC

## 2017-04-13 NOTE — Progress Notes (Signed)
Subjective:  Patient ID: Brent Holt, male    DOB: [birth year]  Age: 61 y.o. MRN: [id number]  CC: Hypertension   HPI Brent Holt presents for HTN/HLD follow up. He is not exercising and is adherent to low salt diet. He does not check BP at home. Cardiac symptoms none. Patient denies chest pain, chest pressure/discomfort, claudication, lower extremity edema, near-syncope, palpitations and syncopeHe does report occasional dizziness when moving from bending to standing. History of hyponatremia. Contributing factors: alcohol  beers per 60 to 70 oz. Cardiovascular risk factors: advanced age (older than 13 for men, 16 for women), dyslipidemia, hypertension, male gender, sedentary lifestyle and smoking/ tobacco exposure. He is not ready to quit at this time.Use of agents associated with hypertension: none. History of target organ damage: none. History of COPD. Patient denies dyspnea, wheezing and fever. Patient currently is not on home oxygen therapy. Respiratory history: COPD and emphysema.    Outpatient Medications Prior to Visit  Medication Sig Dispense Refill  . atorvastatin (LIPITOR) 20 MG tablet TAKE 1 TABLET BY MOUTH ONCE DAILY 90 tablet 0  . budesonide-formoterol (SYMBICORT) 160-4.5 MCG/ACT inhaler Inhale 2 puffs into the lungs 2 (two) times daily. 1 Inhaler 5  . dorzolamide-timolol (COSOPT) 22.3-6.8 MG/ML ophthalmic solution Place 1 drop into both eyes 2 (two) times daily.    Marland Kitchen albuterol (PROVENTIL HFA;VENTOLIN HFA) 108 (90 Base) MCG/ACT inhaler Inhale 2 puffs into the lungs every 6 (six) hours as needed for wheezing or shortness of breath. 1 Inhaler 2  . amLODipine (NORVASC) 10 MG tablet Take 1 tablet (10 mg total) by mouth daily. 90 tablet 0  . lisinopril (PRINIVIL,ZESTRIL) 40 MG tablet TAKE 1 TABLET BY MOUTH ONCE DAILY 30 tablet 0  . metoprolol succinate (TOPROL-XL) 25 MG 24 hr tablet Take 1 tablet (25 mg total) by mouth daily. 90 tablet 0   No facility-administered medications prior to  visit.     ROS Review of Systems  Constitutional: Negative.   Eyes: Negative.   Respiratory: Negative.   Cardiovascular: Negative.   Gastrointestinal: Negative.   Neurological: Negative.    Objective:  BP (!) 95/59 (BP Location: Left Arm, Patient Position: Sitting, Cuff Size: Normal)   Pulse 82   Temp 98.1 F (36.7 C) (Oral)   Resp 18   Ht 5\' 10"  (1.778 m)   Wt 132 lb (59.9 kg)   SpO2 97%   BMI 18.94 kg/m   BP/Weight 04/13/2017 03/31/2017 01/21/2017  Systolic BP 95 138 155  Diastolic BP 59 96 88  Wt. (Lbs) 132 133 -  BMI 18.94 19.08 -   Physical Exam  Constitutional: He is oriented to person, place, and time. He appears well-developed and well-nourished.  Eyes: Pupils are equal, round, and reactive to light. Conjunctivae are normal.  Neck: Normal range of motion. Neck supple. No JVD present.  Cardiovascular: Normal rate, regular rhythm, normal heart sounds and intact distal pulses.   Pulmonary/Chest: Effort normal and breath sounds normal. He has no wheezes.  Abdominal: Soft. Bowel sounds are normal. There is no tenderness.  Neurological: He is alert and oriented to person, place, and time.  Skin: Skin is warm and dry.  Psychiatric: He has a normal mood and affect.  Nursing note and vitals reviewed.    Assessment & Plan:   Problem List Items Addressed This Visit      Cardiovascular and Mediastinum   Hypertension - Primary   Relevant Medications   amLODipine (NORVASC) 5 MG tablet   lisinopril (PRINIVIL,ZESTRIL)  40 MG tablet   metoprolol succinate (TOPROL-XL) 25 MG 24 hr tablet   Other Relevant Orders   Lipid Panel (Completed)     Respiratory   COPD (chronic obstructive pulmonary disease) (HCC)   Relevant Medications   albuterol (PROVENTIL HFA;VENTOLIN HFA) 108 (90 Base) MCG/ACT inhaler    Other Visit Diagnoses    Orthostatic dizziness       Orthostatic BP taken   Reduced amlodipine to 5 mg QD.    Schedule BP check with nurse in 2 weeks.   Medication  refill       Relevant Medications   amLODipine (NORVASC) 5 MG tablet   lisinopril (PRINIVIL,ZESTRIL) 40 MG tablet   metoprolol succinate (TOPROL-XL) 25 MG 24 hr tablet   History of vitamin D deficiency       Relevant Orders   Vitamin D, 25-hydroxy (Completed)   Follow up       Relevant Orders   Basic Metabolic Panel (Completed)      Meds ordered this encounter  Medications  . amLODipine (NORVASC) 5 MG tablet    Sig: Take 1 tablet (5 mg total) by mouth daily.    Dispense:  90 tablet    Refill:  3    Must have office visit for refills.    Order Specific Question:   Supervising Provider    Answer:   Quentin AngstJEGEDE, OLUGBEMIGA E [id number]  . lisinopril (PRINIVIL,ZESTRIL) 40 MG tablet    Sig: Take 1 tablet (40 mg total) by mouth daily.    Dispense:  90 tablet    Refill:  3    Must have office visit for refills    Order Specific Question:   Supervising Provider    Answer:   Quentin AngstJEGEDE, OLUGBEMIGA E [id number]  . albuterol (PROVENTIL HFA;VENTOLIN HFA) 108 (90 Base) MCG/ACT inhaler    Sig: Inhale 2 puffs into the lungs every 6 (six) hours as needed for wheezing or shortness of breath.    Dispense:  1 Inhaler    Refill:  2    Order Specific Question:   Supervising Provider    Answer:   Quentin AngstJEGEDE, OLUGBEMIGA E [id number]  . metoprolol succinate (TOPROL-XL) 25 MG 24 hr tablet    Sig: Take 1 tablet (25 mg total) by mouth daily.    Dispense:  90 tablet    Refill:  3    Must have office visit for refills.    Order Specific Question:   Supervising Provider    Answer:   Quentin AngstJEGEDE, OLUGBEMIGA E [[id number]]    Follow-up: Return in about 2 weeks (around 04/27/2017) for BP check with Travia.   Lizbeth BarkMandesia R Kenidi Elenbaas FNP

## 2017-04-13 NOTE — Progress Notes (Signed)
Patient is here for medication refill  

## 2017-04-13 NOTE — Patient Instructions (Addendum)
Hypotension Hypotension, commonly called low blood pressure, is when the force of blood pumping through your arteries is too weak. Arteries are blood vessels that carry blood from the heart throughout the body. When blood pressure is too low, you may not get enough blood to your brain or to the rest of your organs. This can cause weakness, light-headedness, rapid heartbeat, and fainting. Depending on the cause and severity, hypotension may be harmless (benign) or cause serious problems (critical). What are the causes? Possible causes of hypotension include:  Blood loss.  Loss of body fluids (dehydration).  Heart problems.  Hormone (endocrine) problems.  Pregnancy.  Severe infection.  Lack of certain nutrients.  Severe allergic reactions (anaphylaxis).  Certain medicines, such as blood pressure medicine or medicines that make the body lose excess fluids (diuretics). Sometimes, hypotension can be caused by not taking medicine as directed, such as taking too much of a certain medicine.  What increases the risk? Certain factors can make you more likely to develop hypotension, including:  Age. Risk increases as you get older.  Conditions that affect the heart or the central nervous system.  Taking certain medicines, such as blood pressure medicine or diuretics.  Being pregnant.  What are the signs or symptoms? Symptoms of this condition may include:  Weakness.  Light-headedness.  Dizziness.  Blurred vision.  Fatigue.  Rapid heartbeat.  Fainting, in severe cases.  How is this diagnosed? This condition is diagnosed based on:  Your medical history.  Your symptoms.  Your blood pressure measurement. Your health care provider will check your blood pressure when you are: ? Lying down. ? Sitting. ? Standing.  A blood pressure reading is recorded as two numbers, such as "120 over 80" (or 120/80). The first ("top") number is called the systolic pressure. It is a  measure of the pressure in your arteries as your heart beats. The second ("bottom") number is called the diastolic pressure. It is a measure of the pressure in your arteries when your heart relaxes between beats. Blood pressure is measured in a unit called mm Hg. Healthy blood pressure for adults is 120/80. If your blood pressure is below 90/60, you may be diagnosed with hypotension. Other information or tests that may be used to diagnose hypotension include:  Your other vital signs, such as your heart rate and temperature.  Blood tests.  Tilt table test. For this test, you will be safely secured to a table that moves you from a lying position to an upright position. Your heart rhythm and blood pressure will be monitored during the test.  How is this treated? Treatment for this condition may include:  Changing your diet. This may involve eating more salt (sodium) or drinking more water.  Taking medicines to raise your blood pressure.  Changing the dosage of certain medicines you are taking that might be lowering your blood pressure.  Wearing compression stockings. These stockings help to prevent blood clots and reduce swelling in your legs.  In some cases, you may need to go to the hospital for:  Fluid replacement. This means you will receive fluids through an IV tube.  Blood replacement. This means you will receive donated blood through an IV tube (transfusion).  Treating an infection or heart problems, if this applies.  Monitoring. You may need to be monitored while medicines that you are taking wear off.  Follow these instructions at home: Eating and drinking   Drink enough fluid to keep your urine clear or pale yellow.    Eat a healthy diet and follow instructions from your health care provider about eating or drinking restrictions. A healthy diet includes: ? Fresh fruits and vegetables. ? Whole grains. ? Lean meats. ? Low-fat dairy products.  Eat extra salt only as  directed. Do not add extra salt to your diet unless your health care provider told you to do that.  Eat frequent, small meals.  Avoid standing up suddenly after eating. Medicines  Take over-the-counter and prescription medicines only as told by your health care provider. ? Follow instructions from your health care provider about changing the dosage of your current medicines, if this applies. ? Do not stop or adjust any of your medicines on your own. General instructions  Wear compression stockings as told by your health care provider.  Get up slowly from lying down or sitting positions. This gives your blood pressure a chance to adjust.  Avoid hot showers and excessive heat as directed by your health care provider.  Return to your normal activities as told by your health care provider. Ask your health care provider what activities are safe for you.  Do not use any products that contain nicotine or tobacco, such as cigarettes and e-cigarettes. If you need help quitting, ask your health care provider.  Keep all follow-up visits as told by your health care provider. This is important. Contact a health care provider if:  You vomit.  You have diarrhea.  You have a fever for more than 2-3 days.  You feel more thirsty than usual.  You feel weak and tired. Get help right away if:  You have chest pain.  You have a fast or irregular heartbeat.  You develop numbness in any part of your body.  You cannot move your arms or your legs.  You have trouble speaking.  You become sweaty or feel light-headed.  You faint.  You feel short of breath.  You have trouble staying awake.  You feel confused. This information is not intended to replace advice given to you by your health care provider. Make sure you discuss any questions you have with your health care provider. Document Released: 08/25/2005 Document Revised: 03/14/2016 Document Reviewed: 02/14/2016 Elsevier Interactive  Patient Education  2018 [employer]  

## 2017-04-14 LAB — LIPID PANEL
Chol/HDL Ratio: 2.4 ratio (ref 0.0–5.0)
Cholesterol, Total: 104 mg/dL (ref 100–199)
HDL: 44 mg/dL (ref >39)
LDL Calculated: 37 mg/dL (ref 0–99)
Triglycerides: 114 mg/dL (ref 0–149)
VLDL CHOLESTEROL CAL: 23 mg/dL (ref 5–40)

## 2017-04-14 LAB — BASIC METABOLIC PANEL
BUN/Creatinine Ratio: 8 — ABNORMAL LOW (ref 10–24)
BUN: 11 mg/dL (ref 8–27)
CO2: 24 mmol/L (ref 20–29)
CREATININE: 1.3 mg/dL — AB (ref 0.76–1.27)
Calcium: 9.1 mg/dL (ref 8.6–10.2)
Chloride: 107 mmol/L — ABNORMAL HIGH (ref 96–106)
GFR calc Af Amer: 68 mL/min/{1.73_m2} (ref >59)
GFR calc non Af Amer: 59 mL/min/{1.73_m2} — ABNORMAL LOW (ref >59)
GLUCOSE: 88 mg/dL (ref 65–99)
POTASSIUM: 4.3 mmol/L (ref 3.5–5.2)
SODIUM: 144 mmol/L (ref 134–144)

## 2017-04-14 LAB — VITAMIN D 25 HYDROXY (VIT D DEFICIENCY, FRACTURES): VIT D 25 HYDROXY: 45.8 ng/mL (ref 30.0–100.0)

## 2017-04-24 ENCOUNTER — Telehealth: Payer: Self-pay

## 2017-04-24 ENCOUNTER — Other Ambulatory Visit: Payer: Self-pay | Admitting: Family Medicine

## 2017-04-24 DIAGNOSIS — E782 Mixed hyperlipidemia: Secondary | ICD-10-CM

## 2017-04-24 MED ORDER — ATORVASTATIN CALCIUM 20 MG PO TABS: 20.0000 mg | ORAL_TABLET | Freq: Every day | ORAL | 3 refills | Status: DC

## 2017-04-24 NOTE — Telephone Encounter (Signed)
-----   Message from Lizbeth Bark, FNP sent at 04/24/2017  8:39 AM EDT ----- -Cholesterol level controlled on current dosage of atorvastatin -Kidney function has decreased. Levels indicate you have chronic kidney disease stage 3. -Continue to take your medications for blood pressure, avoid taking NSAID medications, reduce salt intake to 2 to 4 grams/day, quit smoking.  -Recommend monitoring again in 6 months. If your levels have signifcantly increased you will be referred to nephrology. Vitamin d is normal. Vitamin D helps to keep bones strong.

## 2017-04-24 NOTE — Telephone Encounter (Signed)
CMA call regarding lab results   Patient verify DOB  Patient was aware and understood  

## 2017-08-04 ENCOUNTER — Ambulatory Visit: Payer: 59 | Admitting: Emergency Medicine

## 2017-08-05 DIAGNOSIS — I10 Essential (primary) hypertension: Secondary | ICD-10-CM

## 2017-10-05 ENCOUNTER — Ambulatory Visit: Payer: 59 | Admitting: Emergency Medicine

## 2017-12-01 DIAGNOSIS — I10 Essential (primary) hypertension: Secondary | ICD-10-CM

## 2017-12-02 IMAGING — CT CT ANGIO CHEST
2 of 6 series · 18 of 36 positions shown · IV contrast (isovue)
Comparison: Chest x-ray 09/11/2016

CLINICAL DATA: Exertional shortness of breath with abnormal chest
x-ray

EXAM:
CT ANGIOGRAPHY CHEST WITH CONTRAST
TECHNIQUE: Multidetector CT imaging of the chest was performed using the
standard protocol during bolus administration of intravenous
contrast. Multiplanar CT image reconstructions and MIPs were
obtained to evaluate the vascular anatomy.
CONTRAST:  100 mL Isovue 370 intravenous

[Series 6: pe thins · axial · 0.71mm/px · z∈[+1245,+1543]mm · 17 of 336 slices shown]
[im 19/336  lung]
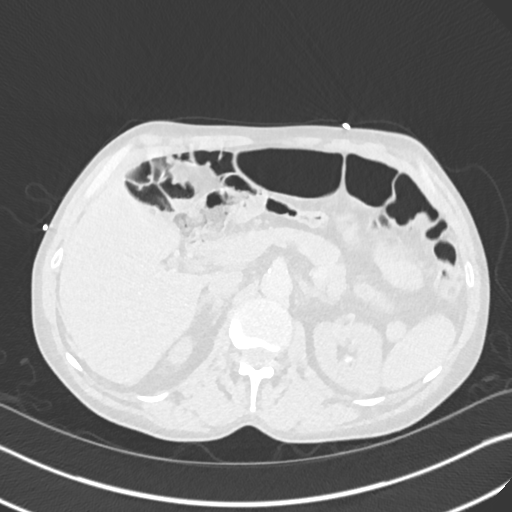
[im 38/336  mediastinal]
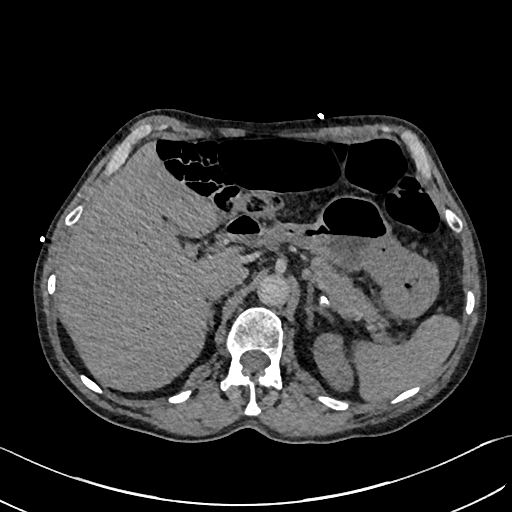
[im 56/336  lung]
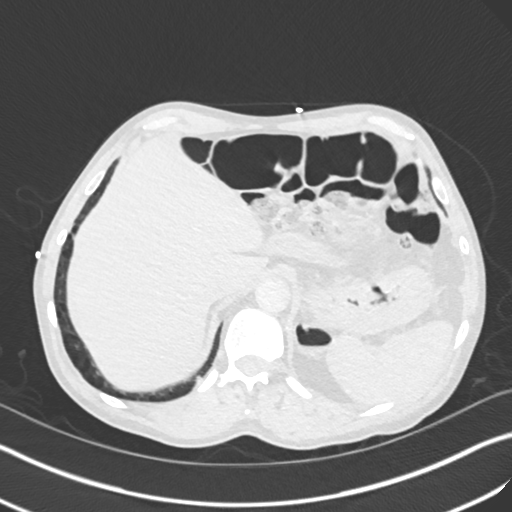
[im 75/336  mediastinal]
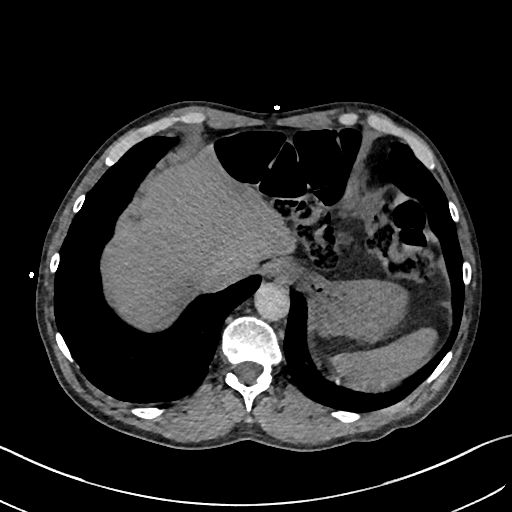
[im 94/336  lung]
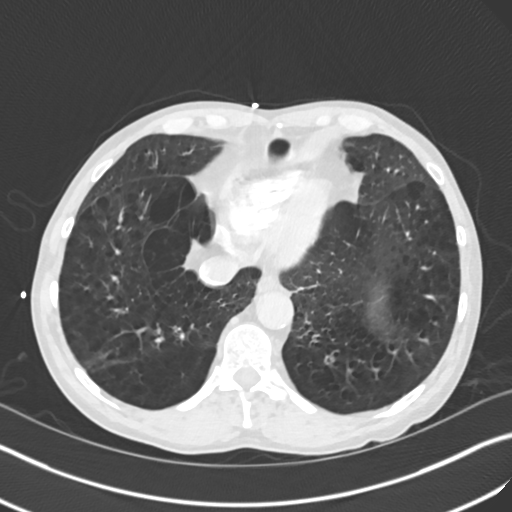
[im 112/336  mediastinal]
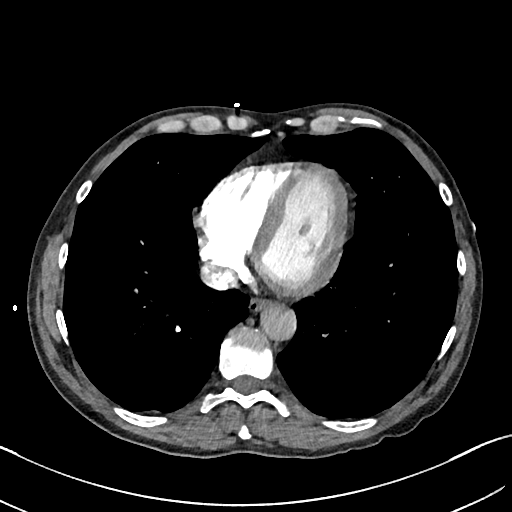
[im 131/336  lung]
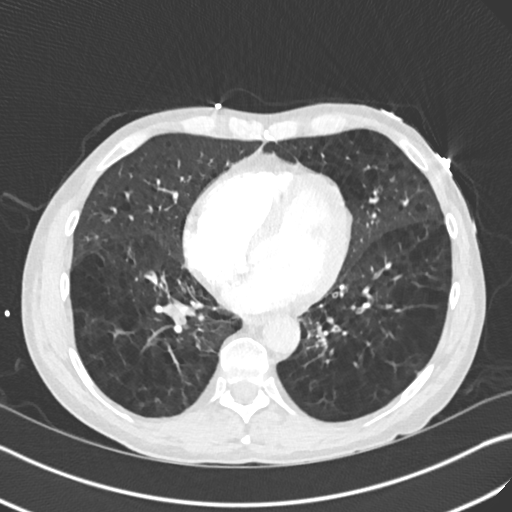
[im 149/336  mediastinal]
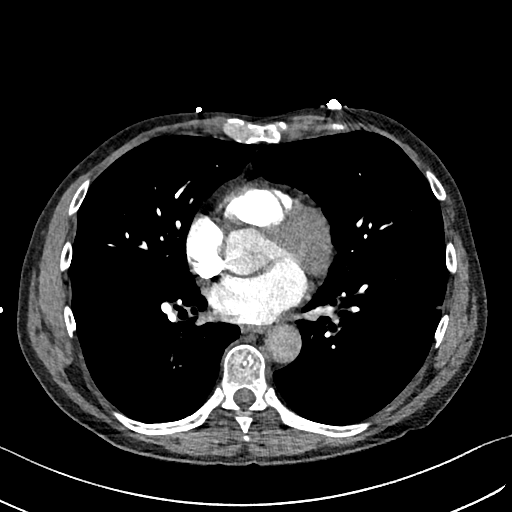
[im 168/336  lung]
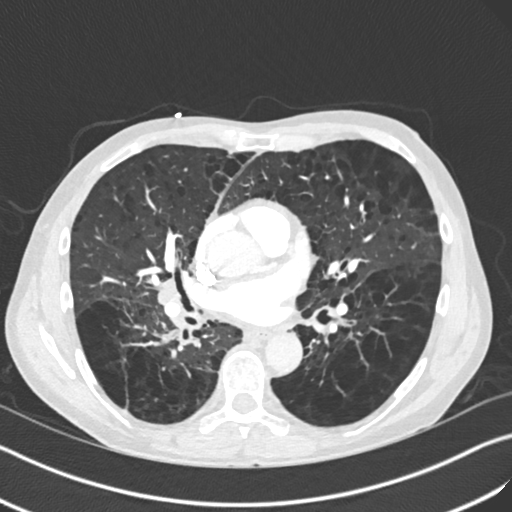
[im 187/336  mediastinal]
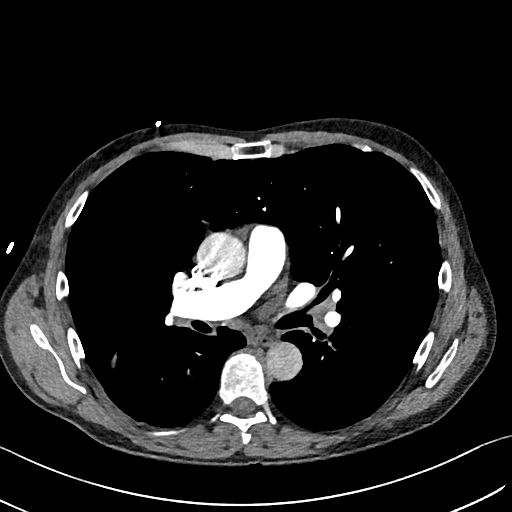
[im 205/336  lung]
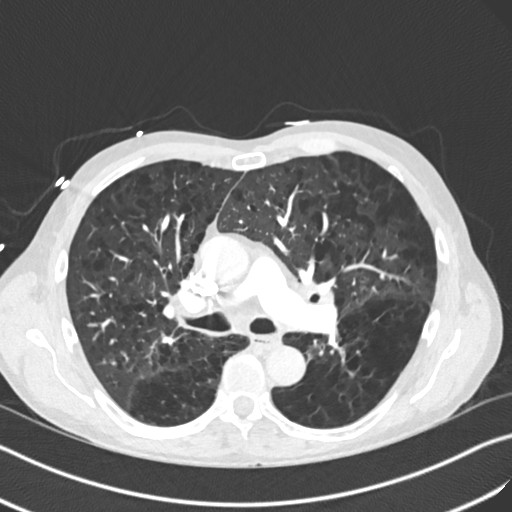
[im 224/336  mediastinal]
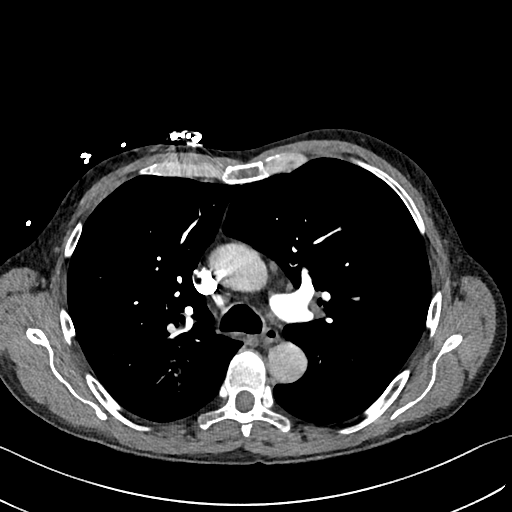
[im 242/336  lung]
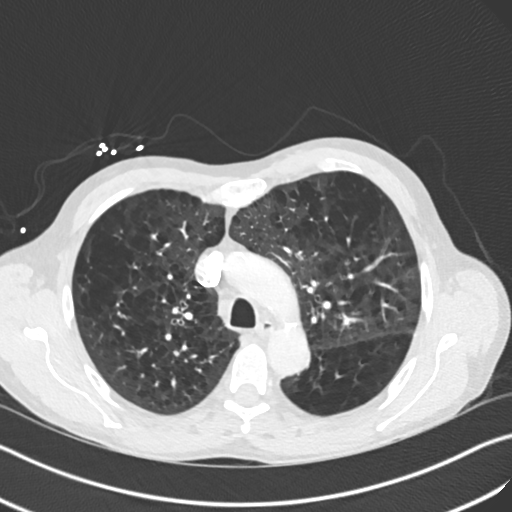
[im 261/336  mediastinal]
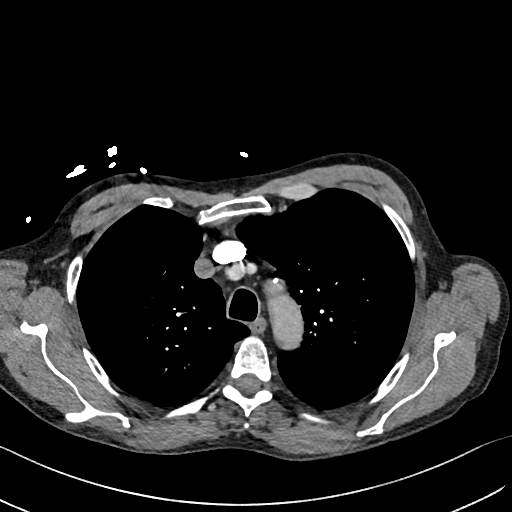
[im 280/336  lung]
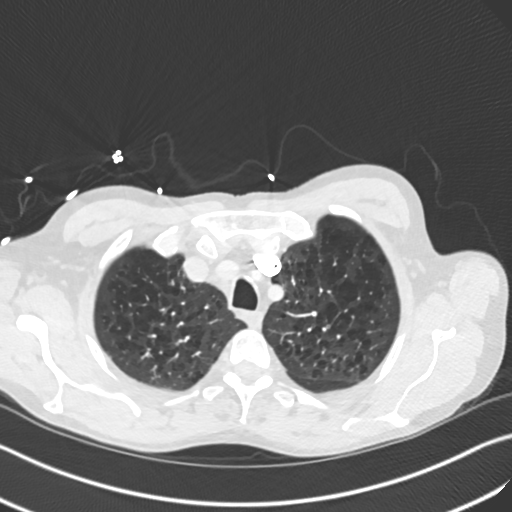
[im 298/336  mediastinal]
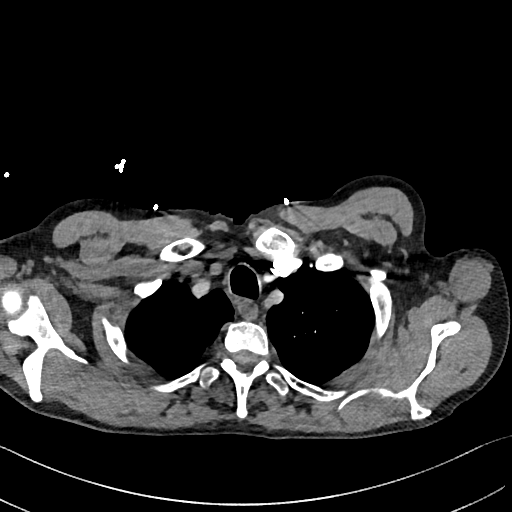
[im 317/336  lung]
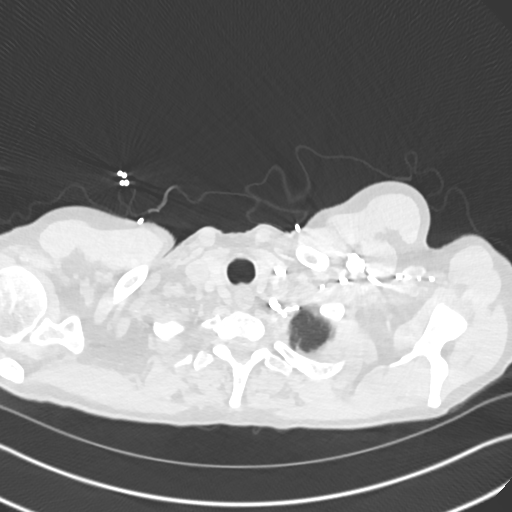

[Series 7: pe 2mm cor · coronal · 0.66mm/px · 1 of 129 slices shown]
[im 65/129  mediastinal]
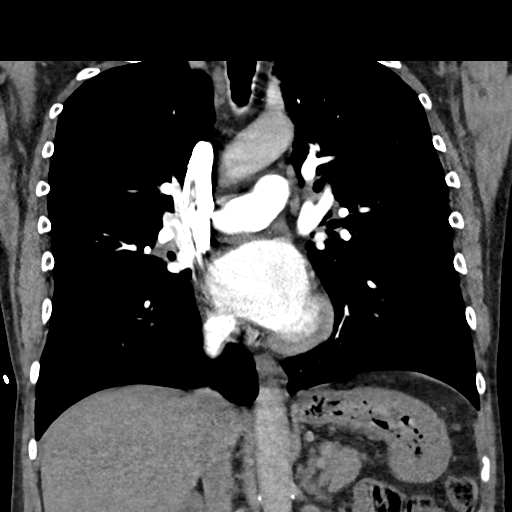

[18 of 36 positions shown; findings below may reference images not displayed]

FINDINGS: Cardiovascular: Satisfactory opacification of the pulmonary arteries
to the segmental level. No evidence of pulmonary embolism.
Atherosclerotic calcifications in the aorta. No aneurysm. No obvious
dissection. Heart size nonenlarged. Trace pericardial effusion or
thickening.

Mediastinum/Nodes: Esophagus is within normal limits. No significant
mediastinal adenopathy. subcentimeter nonspecific mediastinal lymph
nodes. Thyroid gland normal. Trachea is midline.

Lungs/Pleura: Severe emphysematous changes are present bilaterally.
No acute consolidation or pleural effusion is visualized. There is
no pneumothorax. There are several pulmonary nodules. Within the
subpleural left lower lobe, there is a 5 mm nodule, series 5, image
number 94. Within the superior aspect of the right lower lobe, there
is a 1.5 by 0.7 cm nodule. Slightly inferior and posterior to this
on series 5, image number 88 is an additional hyperdense nodule
measuring 0.9 by 0.6 cm. One of the nodules is felt to correspond to
the radiographic abnormality.

Upper Abdomen: No acute abnormality is visualized. Partially
visualized small stones in the mid and upper pole of the left
kidney.

Musculoskeletal: No chest wall abnormality. No acute or significant
osseous findings.

Review of the MIP images confirms the above findings.
IMPRESSION: 1. No CT evidence for acute pulmonary embolus or aortic dissection
2. Severe emphysematous changes bilaterally. Several bilateral
pulmonary nodules are visualized, measuring up to 1.5 cm. No
follow-up needed if patient is low-risk (and has no known or
suspected primary neoplasm). Non-contrast chest CT can be considered
in 12 months if patient is high-risk. This recommendation follows
the consensus statement: Guidelines for Management of Incidental
Pulmonary Nodules Detected on CT Images: From the [HOSPITAL]
3. Left kidney stones

## 2018-04-21 ENCOUNTER — Ambulatory Visit: Payer: 59 | Attending: Nurse Practitioner | Admitting: Nurse Practitioner

## 2018-04-21 ENCOUNTER — Encounter: Payer: Self-pay | Admitting: Nurse Practitioner

## 2018-04-21 VITALS — BP 146/95 | HR 67 | Temp 97.9°F | Ht 69.0 in | Wt 126.4 lb

## 2018-04-21 DIAGNOSIS — Z8249 Family history of ischemic heart disease and other diseases of the circulatory system: Secondary | ICD-10-CM | POA: Diagnosis not present

## 2018-04-21 DIAGNOSIS — E785 Hyperlipidemia, unspecified: Secondary | ICD-10-CM | POA: Diagnosis not present

## 2018-04-21 DIAGNOSIS — H409 Unspecified glaucoma: Secondary | ICD-10-CM | POA: Diagnosis not present

## 2018-04-21 DIAGNOSIS — Z760 Encounter for issue of repeat prescription: Secondary | ICD-10-CM | POA: Insufficient documentation

## 2018-04-21 DIAGNOSIS — J449 Chronic obstructive pulmonary disease, unspecified: Secondary | ICD-10-CM | POA: Insufficient documentation

## 2018-04-21 DIAGNOSIS — J431 Panlobular emphysema: Secondary | ICD-10-CM

## 2018-04-21 DIAGNOSIS — Z79899 Other long term (current) drug therapy: Secondary | ICD-10-CM | POA: Diagnosis not present

## 2018-04-21 DIAGNOSIS — R918 Other nonspecific abnormal finding of lung field: Secondary | ICD-10-CM | POA: Diagnosis not present

## 2018-04-21 DIAGNOSIS — N529 Male erectile dysfunction, unspecified: Secondary | ICD-10-CM | POA: Insufficient documentation

## 2018-04-21 DIAGNOSIS — I10 Essential (primary) hypertension: Secondary | ICD-10-CM | POA: Diagnosis present

## 2018-04-21 DIAGNOSIS — M109 Gout, unspecified: Secondary | ICD-10-CM | POA: Insufficient documentation

## 2018-04-21 MED ORDER — METOPROLOL SUCCINATE ER 25 MG PO TB24: 25.0000 mg | ORAL_TABLET | Freq: Every day | ORAL | 3 refills | Status: DC

## 2018-04-21 MED ORDER — LISINOPRIL 40 MG PO TABS: 40.0000 mg | ORAL_TABLET | Freq: Every day | ORAL | 3 refills | Status: DC

## 2018-04-21 MED ORDER — ALBUTEROL SULFATE HFA 108 (90 BASE) MCG/ACT IN AERS: 2.0000 | INHALATION_SPRAY | Freq: Four times a day (QID) | RESPIRATORY_TRACT | 2 refills | Status: DC | PRN

## 2018-04-21 MED ORDER — AMLODIPINE BESYLATE 5 MG PO TABS: 5.0000 mg | ORAL_TABLET | Freq: Every day | ORAL | 3 refills | Status: DC

## 2018-04-21 NOTE — Progress Notes (Signed)
Assessment & Plan:  Brent Holt was seen today for establish care and medication refill.  Diagnoses and all orders for this visit:  Essential hypertension -     amLODipine (NORVASC) 5 MG tablet; Take 1 tablet (5 mg total) by mouth daily. -     metoprolol succinate (TOPROL-XL) 25 MG 24 hr tablet; Take 1 tablet (25 mg total) by mouth daily. -     lisinopril (PRINIVIL,ZESTRIL) 40 MG tablet; Take 1 tablet (40 mg total) by mouth daily. -     CBC -     CMP14+EGFR Continue all antihypertensives as prescribed.  Remember to bring in your blood pressure log with you for your follow up appointment.  DASH/Mediterranean Diets are healthier choices for HTN.    Panlobular emphysema (HCC) -     Discontinue: albuterol (PROVENTIL HFA;VENTOLIN HFA) 108 (90 Base) MCG/ACT inhaler; Inhale 2 puffs into the lungs every 6 (six) hours as needed for wheezing or shortness of breath. -     Ambulatory referral to Pulmonology    Patient has been counseled on age-appropriate routine health concerns for screening and prevention. These are reviewed and up-to-date. Referrals have been placed accordingly. Immunizations are up-to-date or declined.    Subjective:   Chief Complaint  Patient presents with  . Establish Care    Pt. is here to establish care for hypertension.   . Medication Refill    Pt. is here for medication.    HPI Brent Holt 62 y.o. male presents to office today to establish care. 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    CHRONIC HYPERTENSION Disease Monitoring  Blood pressure range: Not well controlled today. He continues to smoke. BP Readings from Last 3 Encounters:  04/21/18 (!) 146/95  04/13/17 (!) 95/59  03/31/17 (!) 138/96   Chest pain: no   Dyspnea: no   Claudication: no  Medication compliance: yes; taking metoprolol '25mg'$  daily, amlodipine '5mg'$  and Lisinopril '40mg'$  daily.     Medication Side Effects  Lightheadedness: no   Urinary frequency: no   Edema: no   Impotence: yes  Preventitive Healthcare:  Exercise: no   Diet Pattern: diet: general  Salt Restriction:  no   COPD He has a history of COPD and lung nodules. Previously treated by pulmonologist Dr. Lamonte Sakai. However reports he [facility] the [facility] a bill and is not sure if he can afford to see Pulmonology again. He has not been able to afford his symbicort and has been using albuterol only. He states he has not had to use his rescue inhaler (albuterol) for several weeks. I will place a referral back to Dr. Lamonte Sakai. He needs follow up for CT from 03-2017. He currently denies any hemoptysis, productive cough, orthopnea or  wheezing, chills, fever and colored sputum. Chest CT: 03-25-2018 Stable severe emphysematous changes. Stable superior segment right lower lobe pulmonary nodules with benign imaging characteristics but the the continued surveillance is suggested. Twelve month followup noncontrast CT is suggested. No acute pulmonary findings      Review of Systems  Constitutional: Negative for fever, malaise/fatigue and weight loss.  HENT: Negative.  Negative for nosebleeds.   Eyes: Negative for blurred vision, double vision and photophobia.       Glaucoma and Cataracts  Respiratory: Positive for cough. Negative for shortness of breath.   Cardiovascular: Negative.  Negative for chest pain, palpitations and leg swelling.  Gastrointestinal: Negative.  Negative for heartburn, nausea and vomiting.  Musculoskeletal: Negative.  Negative for myalgias.  Neurological: Negative.  Negative for dizziness, focal weakness, seizures and headaches.  Psychiatric/Behavioral: Positive for depression and substance abuse (ETOH ABUSE). Negative for suicidal ideas.    Past Medical History:  Diagnosis Date  . COPD (chronic obstructive pulmonary disease) ([place])   . Glaucoma   . Gout   . Hyperlipidemia   . Hypertension      Past Surgical History:  Procedure Laterality Date  . HEMORRHOID SURGERY      Family History  Problem Relation Age of Onset  . Hypertension Father   . Glaucoma Father     Social History Reviewed with no changes to be made today.   Outpatient Medications Prior to Visit  Medication Sig Dispense Refill  . atorvastatin (LIPITOR) 20 MG tablet Take 1 tablet (20 mg total) by mouth daily. 90 tablet 3  . dorzolamide-timolol (COSOPT) 22.3-6.8 MG/ML ophthalmic solution Place 1 drop into both eyes 2 (two) times daily.    . timolol (TIMOPTIC) 0.25 % ophthalmic solution 1 drop 2 (two) times daily.    Marland Kitchen albuterol (PROVENTIL HFA;VENTOLIN HFA) 108 (90 Base) MCG/ACT inhaler Inhale 2 puffs into the lungs every 6 (six) hours as needed for wheezing or shortness of breath. 1 Inhaler 2  . amLODipine (NORVASC) 5 MG tablet Take 1 tablet (5 mg total) by mouth daily. 90 tablet 3  . lisinopril (PRINIVIL,ZESTRIL) 40 MG tablet Take 1 tablet (40 mg total) by mouth daily. 90 tablet 3  . metoprolol succinate (TOPROL-XL) 25 MG 24 hr tablet Take 1 tablet (25 mg total) by mouth daily. 90 tablet 3  . budesonide-formoterol (SYMBICORT) 160-4.5 MCG/ACT inhaler Inhale 2 puffs into the lungs 2 (two) times daily. (Patient not taking: Reported on 04/21/2018) 1 Inhaler 5   No facility-administered medications prior to visit.     No Known Allergies     Objective:    BP (!) 146/95 (BP Location: Left Arm, Patient Position: Sitting, Cuff Size: Normal)   Pulse 67   Temp 97.9 F (36.6 C) (Oral)   Ht '5\' 9"'$  (1.753 m)   Wt 126 lb 6.4 oz (57.3 kg)   SpO2 97%   BMI 18.67 kg/m  Wt Readings from Last 3 Encounters:  04/21/18 126 lb 6.4 oz (57.3 kg)  04/13/17 132 lb (59.9 kg)  03/31/17 133 lb (60.3 kg)    Physical Exam  Constitutional: He is oriented to person, place, and time. He appears well-developed and well-nourished. He is cooperative.  HENT:  Head: Normocephalic and atraumatic.  Eyes: EOM are normal.  Neck:  Normal range of motion.  Cardiovascular: Normal rate, regular rhythm and normal heart sounds. Exam reveals no gallop and no friction rub.  No murmur heard. Pulmonary/Chest: Effort normal and breath sounds normal. No tachypnea. No respiratory distress. He has no decreased breath sounds. He has no wheezes. He has no rhonchi. He has no rales. He exhibits no tenderness.  Abdominal: Bowel sounds are normal.  Musculoskeletal: Normal range of motion. He exhibits no edema.  Neurological: He is alert and oriented to person, place, and time. Coordination normal.  Skin: Skin is warm and dry.  Ruddy hue to skin  Psychiatric: He has a normal mood and affect. His behavior is normal. Judgment and thought content normal.  Nursing note and vitals reviewed.      Patient has been counseled extensively about nutrition and exercise as well as the importance of adherence with medications and  regular follow-up. The patient was given clear instructions to go to ER or return to medical center if symptoms don't improve, worsen or new problems develop. The patient verbalized understanding.   Follow-up: Return for needs physical .   Gildardo Pounds, FNP-BC [facility] [place] [place]   04/21/2018, 9:48 PM

## 2018-04-22 LAB — CMP14+EGFR
ALBUMIN: 3.8 g/dL (ref 3.6–4.8)
ALT: 16 IU/L (ref 0–44)
AST: 34 IU/L (ref 0–40)
Albumin/Globulin Ratio: 1.2 (ref 1.2–2.2)
Alkaline Phosphatase: 54 IU/L (ref 39–117)
BILIRUBIN TOTAL: 0.6 mg/dL (ref 0.0–1.2)
BUN/Creatinine Ratio: 9 — ABNORMAL LOW (ref 10–24)
BUN: 9 mg/dL (ref 8–27)
CALCIUM: 8.6 mg/dL (ref 8.6–10.2)
CHLORIDE: 101 mmol/L (ref 96–106)
CO2: 24 mmol/L (ref 20–29)
CREATININE: 1.03 mg/dL (ref 0.76–1.27)
GFR calc Af Amer: 90 mL/min/{1.73_m2} (ref >59)
GFR calc non Af Amer: 77 mL/min/{1.73_m2} (ref >59)
GLUCOSE: 138 mg/dL — AB (ref 65–99)
Globulin, Total: 3.1 g/dL (ref 1.5–4.5)
Potassium: 3.9 mmol/L (ref 3.5–5.2)
Sodium: 137 mmol/L (ref 134–144)
Total Protein: 6.9 g/dL (ref 6.0–8.5)

## 2018-04-22 LAB — CBC
HEMOGLOBIN: 14.9 g/dL (ref 13.0–17.7)
Hematocrit: 44.2 % (ref 37.5–51.0)
MCH: 35.1 pg — AB (ref 26.6–33.0)
MCHC: 33.7 g/dL (ref 31.5–35.7)
MCV: 104 fL — ABNORMAL HIGH (ref 79–97)
Platelets: 191 10*3/uL (ref 150–450)
RBC: 4.25 x10E6/uL (ref 4.14–5.80)
RDW: 14.1 % (ref 12.3–15.4)
WBC: 6.7 10*3/uL (ref 3.4–10.8)

## 2018-04-29 ENCOUNTER — Telehealth: Payer: Self-pay

## 2018-04-29 NOTE — Telephone Encounter (Signed)
CMA spoke to patient to inform on lab results.  Pt. Verified DOB Pt. Understood.  

## 2018-04-29 NOTE — Telephone Encounter (Signed)
-----   Message from Claiborne RiggZelda W Fleming, NP sent at 04/29/2018 11:07 AM EDT ----- Glucose is elevated. Will check for diabetes at your next appointment in September. All other labs are essentially normal. There are some minor variations in your blood work that do not require any additional work up at this time. Will continue to monitor. Make sure you are drinking at least 48 oz of water per day. Work on eating a low fat, heart healthy diet and participate in regular aerobic exercise program to control as well. Exercise at least  30 minutes per day-5 days per week. Avoid red meat. No fried foods. No junk foods, sodas, sugary foods or drinks, unhealthy snacking, alcohol or smoking.

## 2018-05-21 ENCOUNTER — Ambulatory Visit (INDEPENDENT_AMBULATORY_CARE_PROVIDER_SITE_OTHER): Payer: 59 | Admitting: Emergency Medicine

## 2018-05-21 ENCOUNTER — Encounter: Payer: Self-pay | Admitting: Emergency Medicine

## 2018-05-21 DIAGNOSIS — Z23 Encounter for immunization: Secondary | ICD-10-CM | POA: Diagnosis not present

## 2018-05-21 DIAGNOSIS — Z720 Tobacco use: Secondary | ICD-10-CM

## 2018-05-21 DIAGNOSIS — J431 Panlobular emphysema: Secondary | ICD-10-CM | POA: Diagnosis not present

## 2018-05-21 DIAGNOSIS — R918 Other nonspecific abnormal finding of lung field: Secondary | ICD-10-CM | POA: Diagnosis not present

## 2018-05-21 MED ORDER — GLYCOPYRROLATE-FORMOTEROL 9-4.8 MCG/ACT IN AERO: 2.0000 | INHALATION_SPRAY | Freq: Two times a day (BID) | RESPIRATORY_TRACT | 0 refills | Status: DC

## 2018-05-21 NOTE — Patient Instructions (Addendum)
We will try starting Bevespi 2 puffs twice a day.  This will be your maintenance medication.  We will work on getting financial assistance and use coupons and samples to supplement you. Keep your albuterol available to use 2 puffs up to every 4 hours if needed for shortness of breath, chest tightness, wheezing. We will give the Pneumovax pneumonia shot today. You will need the Prevnar-13 pneumonia shot next year. We will give the flu shot today. Please continue to work on trying to decrease your smoking. We will arrange for a CT scan of the chest without contrast to follow your pulmonary nodules. Follow with Dr Delton CoombesByrum next available after your CT scan to review the results together.

## 2018-05-21 NOTE — Assessment & Plan Note (Signed)
We will try starting Bevespi 2 puffs twice a day.  This will be your maintenance medication.  We will work on getting financial assistance and use coupons and samples to supplement you. Keep your albuterol available to use 2 puffs up to every 4 hours if needed for shortness of breath, chest tightness, wheezing. We will give the Pneumovax pneumonia shot today. You will need the Prevnar-13 pneumonia shot next year. We will give the flu shot today. Please continue to work on trying to decrease your smoking.

## 2018-05-21 NOTE — Assessment & Plan Note (Signed)
Discussed cessation with him today.  He smoking half a pack a day is not really in a position to set a quit date

## 2018-05-21 NOTE — Assessment & Plan Note (Signed)
Right upper lobe and superior segmental nodules.  He needs a follow-up CT scan and we will arrange for this now.

## 2018-05-21 NOTE — Progress Notes (Signed)
Subjective:    Patient ID: Brent Holt, male    DOB: Dec 10, 1955, 62 y.o.   MRN: [id number]  HPI  62 year old man, current smoker (80 pack years) with hx HTN, gout, glaucoma, L rib fracture with a pneumothorax about 10 yrs ago. He went to the ED 09/12/16 with exertional dyspnea and a productive cough. ED notes indicate that he had wheezing on exam. He was treated with bronchodilators and did feel some improvement in his symptoms. He was treated with levofloxacin, prednisone, and given albuterol inhaler. He had never been diagnosed with COPD before and presents now for further evaluation. A Ct scan chest was done that shows emphysema, scattered pulm nodules. He heats w wood. He has some limitation on activities by his breathing.   ROV 12/18/16 -- follow up visit for hx trobacco, COPD / emphysema on imaging after recent AE-COPD. Also pulm nodules, planning for repeat Ct chest in 03/2017. Allergic rhinitis > we started flonase, improved. A lot less mucous.  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 He has not needed albuterol since last time. He does still have some exertional dyspnea. Some wheeze. Minimal cough.   ROV 03/31/17 -- Patient has a history of COPD and emphysema, history tobacco use, allergic rhinitis. As above pulmonary function testing has identified severe obstruction with asthmatic features. Also with pulmonary nodules. We repeated his CT scan of the chest on 03/25/17 that I have personally reviewed. This shows stable severe bullous emphysema with some stable nodular change in the superior segment and right upper lobe. No new nodules seen. He believes that his breathing is stable. He has run out of symbicort due to cost. Notes that he misses it. He has also run out of albuterol. Did not desat last time on walk on RA. No overt flares.   ROV 05/21/18 --62 year old man, smoker with hypertension, gout,  remote left pneumothorax (traumatic) followed for severe COPD with asthmatic features, allergic rhinitis.  Also following some pulmonary nodular disease by CT scan of the chest, last 7/18.  A repeat CT scan hasn't been done - having trouble with medical bills. He is off symbicort due to cost. Uses albuterol occasionally, not every day. He is smoking about 1/2 pk a day. He stopped the vapor. No flares, pred, abx.    Review of Systems  Constitutional: Negative.  Negative for fever and unexpected weight change.  HENT: Negative for congestion, dental problem, ear pain, nosebleeds, postnasal drip, rhinorrhea, sinus pressure, sneezing, sore throat and trouble swallowing.   Eyes: Negative.  Negative for redness and itching.  Respiratory: Positive for shortness of breath. Negative for cough, chest tightness and wheezing.   Cardiovascular: Negative.  Negative for palpitations and leg swelling.  Gastrointestinal: Negative.  Negative for nausea and vomiting.  Genitourinary: Negative.  Negative for dysuria.  Musculoskeletal: Negative.  Negative for joint swelling.  Skin: Negative.  Negative for rash.  Neurological: Negative.  Negative for headaches.  Hematological: Bruises/bleeds easily.  Psychiatric/Behavioral: Negative.  Negative for dysphoric mood. The patient is not nervous/anxious.     Past Medical History:  Diagnosis Date  . COPD (chronic obstructive pulmonary disease) (HCC)   . Glaucoma   . Gout   . Hyperlipidemia   . Hypertension      Family History  Problem Relation Age of Onset  . Hypertension Father   . Glaucoma Father      Social History   Socioeconomic History  .  Marital status: Married    Spouse name: Not on file  . Number of children: Not on file  . Years of education: Not on file  . Highest education level: Not on file  Occupational History  . Not on file  Social Needs  . Financial resource strain: Not on file  . Food insecurity:    Worry: Not on file    Inability:  Not on file  . Transportation needs:    Medical: Not on file    Non-medical: Not on file  Tobacco Use  . Smoking status: Current Every Day Smoker    Packs/day: 0.50    Years: 44.00    Pack years: 22.00    Types: Cigarettes  . Smokeless tobacco: Never Used  Substance and Sexual Activity  . Alcohol use: Yes    Comment: 3 1/2 shots of Bourbon   . Drug use: No  . Sexual activity: Not Currently  Lifestyle  . Physical activity:    Days per week: Not on file    Minutes per session: Not on file  . Stress: Not on file  Relationships  . Social connections:    Talks on phone: Not on file    Gets together: Not on file    Attends religious service: Not on file    Active member of club or organization: Not on file    Attends meetings of clubs or organizations: Not on file    Relationship status: Not on file  . Intimate partner violence:    Fear of current or ex partner: Not on file    Emotionally abused: Not on file    Physically abused: Not on file    Forced sexual activity: Not on file  Other Topics Concern  . Not on file  Social History Narrative  . 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 amLODipine (NORVASC) 5 MG tablet Take 1 tablet (5 mg total) by mouth daily. 90 tablet 3  . atorvastatin (LIPITOR) 20 MG tablet Take 1 tablet (20 mg total) by mouth daily. 90 tablet 3  . dorzolamide-timolol (COSOPT) 22.3-6.8 MG/ML ophthalmic solution Place 1 drop into both eyes 2 (two) times daily.    Marland Kitchen lisinopril (PRINIVIL,ZESTRIL) 40 MG tablet Take 1 tablet (40 mg total) by mouth daily. 90 tablet 3  . metoprolol succinate (TOPROL-XL) 25 MG 24 hr tablet Take 1 tablet (25 mg total) by mouth daily. 90 tablet 3  . timolol (TIMOPTIC) 0.25 % ophthalmic solution 1 drop 2 (two) times daily.    . budesonide-formoterol (SYMBICORT) 160-4.5  MCG/ACT inhaler Inhale 2 puffs into the lungs 2 (two) times daily. (Patient not taking: Reported on 04/21/2018) 1 Inhaler 5   No facility-administered medications prior to visit.         Objective:   Physical Exam Vitals:   05/21/18 0938  BP: 120/80  Pulse: 78  SpO2: 95%  Weight: 122 lb 3.2 oz (55.4 kg)  Height: 5\' 9"  (1.753 m)   Gen: Pleasant, well-nourished, in no distress,  normal affect  ENT: No lesions,  mouth clear,  oropharynx clear, no postnasal drip  Neck: No JVD, no TMG, no carotid bruits  Lungs: No use of accessory muscles, clear without rales or rhonchi  Cardiovascular: RRR, heart sounds normal, no murmur or gallops, no peripheral edema  Musculoskeletal: No  deformities, no cyanosis or clubbing  Neuro: alert, non focal  Skin: Warm, no lesions or rashes      Assessment & Plan:  Tobacco abuse Discussed cessation with him today.  He smoking half a pack a day is not really in a position to set a quit date  Pulmonary nodules Right upper lobe and superior segmental nodules.  He needs a follow-up CT scan and we will arrange for this now.  COPD (chronic obstructive pulmonary disease) (HCC) We will try starting Bevespi 2 puffs twice a day.  This will be your maintenance medication.  We will work on getting financial assistance and use coupons and samples to supplement you. Keep your albuterol available to use 2 puffs up to every 4 hours if needed for shortness of breath, chest tightness, wheezing. We will give the Pneumovax pneumonia shot today. You will need the Prevnar-13 pneumonia shot next year. We will give the flu shot today. Please continue to work on trying to decrease your smoking.  Levy Pupa, MD, PhD 05/21/2018, 10:03 AM  Pulmonary and Critical Care [phone] or if no answer [phone]

## 2018-05-21 NOTE — Addendum Note (Signed)
Addended by: Jaynee EaglesLEMONS, Zeddie Njie C on: 05/21/2018 10:14 AM   Modules accepted: Orders

## 2018-05-21 NOTE — Addendum Note (Signed)
Addended by: Jaynee EaglesLEMONS, LINDSAY C on: 05/21/2018 10:07 AM   Modules accepted: Orders

## 2018-05-24 ENCOUNTER — Other Ambulatory Visit: Payer: Self-pay

## 2018-05-24 DIAGNOSIS — E782 Mixed hyperlipidemia: Secondary | ICD-10-CM

## 2018-05-24 MED ORDER — ATORVASTATIN CALCIUM 20 MG PO TABS: 20.0000 mg | ORAL_TABLET | Freq: Every day | ORAL | 0 refills | Status: DC

## 2018-06-01 ENCOUNTER — Ambulatory Visit (INDEPENDENT_AMBULATORY_CARE_PROVIDER_SITE_OTHER)
Admission: RE | Admit: 2018-06-01 | Discharge: 2018-06-01 | Disposition: A | Discharge status: Home / Self Care | Payer: 59 | Source: Ambulatory Visit | Attending: Emergency Medicine | Admitting: Emergency Medicine

## 2018-06-01 DIAGNOSIS — R918 Other nonspecific abnormal finding of lung field: Secondary | ICD-10-CM | POA: Diagnosis not present

## 2018-06-02 ENCOUNTER — Ambulatory Visit: Payer: PRIVATE HEALTH INSURANCE | Attending: Nurse Practitioner | Admitting: Nurse Practitioner

## 2018-06-02 ENCOUNTER — Encounter: Payer: Self-pay | Admitting: Nurse Practitioner

## 2018-06-02 VITALS — BP 119/79 | HR 69 | Temp 97.8°F | Ht 69.0 in | Wt 122.6 lb

## 2018-06-02 DIAGNOSIS — I10 Essential (primary) hypertension: Secondary | ICD-10-CM | POA: Diagnosis not present

## 2018-06-02 DIAGNOSIS — M109 Gout, unspecified: Secondary | ICD-10-CM | POA: Insufficient documentation

## 2018-06-02 DIAGNOSIS — E785 Hyperlipidemia, unspecified: Secondary | ICD-10-CM | POA: Insufficient documentation

## 2018-06-02 DIAGNOSIS — J449 Chronic obstructive pulmonary disease, unspecified: Secondary | ICD-10-CM | POA: Insufficient documentation

## 2018-06-02 DIAGNOSIS — Z0000 Encounter for general adult medical examination without abnormal findings: Secondary | ICD-10-CM | POA: Insufficient documentation

## 2018-06-02 DIAGNOSIS — Z79899 Other long term (current) drug therapy: Secondary | ICD-10-CM | POA: Insufficient documentation

## 2018-06-02 DIAGNOSIS — R7309 Other abnormal glucose: Secondary | ICD-10-CM

## 2018-06-02 DIAGNOSIS — R739 Hyperglycemia, unspecified: Secondary | ICD-10-CM | POA: Insufficient documentation

## 2018-06-02 DIAGNOSIS — R630 Anorexia: Secondary | ICD-10-CM | POA: Diagnosis not present

## 2018-06-02 DIAGNOSIS — Z1211 Encounter for screening for malignant neoplasm of colon: Secondary | ICD-10-CM

## 2018-06-02 LAB — POCT GLYCOSYLATED HEMOGLOBIN (HGB A1C): HEMOGLOBIN A1C: 4.6 % (ref 4.0–5.6)

## 2018-06-02 LAB — GLUCOSE, POCT (MANUAL RESULT ENTRY): POC GLUCOSE: 139 mg/dL — AB (ref 70–99)

## 2018-06-02 MED ORDER — AMLODIPINE BESYLATE 10 MG PO TABS: 10.0000 mg | ORAL_TABLET | Freq: Every day | ORAL | 1 refills | Status: DC

## 2018-06-02 MED ORDER — CLONIDINE HCL 0.1 MG PO TABS
0.2000 mg | ORAL_TABLET | Freq: Once | ORAL | Status: AC
  Administered 2018-06-02: 0.2 mg via ORAL

## 2018-06-02 NOTE — Progress Notes (Signed)
Assessment & Plan:  Brent Holt was seen today for annual exam.  Diagnoses and all orders for this visit:  Encounter for annual physical exam  Essential hypertension -     cloNIDine (CATAPRES) tablet 0.2 mg Poorly controlled today. Increased amlodipine. He endorses medication compliance. Denies chest pain,  palpitations, lightheadedness, dizziness, headaches or BLE edema.  -     amLODipine (NORVASC) 10 MG tablet; Take 1 tablet (10 mg total) by mouth daily. Continue all antihypertensives as prescribed.  Remember to bring in your blood pressure log with you for your follow up appointment.  DASH/Mediterranean Diets are healthier choices for HTN.    Elevated glucose -     Glucose (CBG) -     HgB A1c Well controlled. Denies any hypo or hyperglycemic symptoms.  Continue blood sugar control as discussed in office today, low carbohydrate diet, and regular physical exercise as tolerated, 150 minutes per week (30 min each day, 5 days per week, or 50 min 3 days per week). Keep blood sugar logs with fasting goal of 90-130 mg/dl, post prandial (after you eat) less than 180.  For Hypoglycemia: BS <60 and Hyperglycemia BS >400; contact the clinic ASAP. Annual eye exams and foot exams are recommended. Lab Results  Component Value Date   HGBA1C 4.6 06/02/2018    Poor appetite -     Lipase -     Amylase  Colon cancer screening -     Fecal occult blood, imunochemical([facility])    Patient has been counseled on age-appropriate routine health concerns for screening and prevention. These are reviewed and up-to-date. Referrals have been placed accordingly. Immunizations are up-to-date or declined.    Subjective:   Chief Complaint  Patient presents with  . Annual Exam    Pt. is here for a physical.    HPI Brent Holt 62 y.o. male presents to office today for annual physical.   Review of Systems  Constitutional: Positive for malaise/fatigue. Negative for fever and weight loss.   Loss of appetite; declines megace today. Encouraged nutritional shakes  HENT: Negative.  Negative for nosebleeds.   Eyes: Negative.  Negative for blurred vision, double vision and photophobia.  Respiratory: Negative.  Negative for cough and shortness of breath.   Cardiovascular: Negative.  Negative for chest pain, palpitations and leg swelling.  Gastrointestinal: Negative.  Negative for heartburn, nausea and vomiting.  Genitourinary: Negative.   Musculoskeletal: Negative.  Negative for myalgias.  Skin: Negative.   Neurological: Negative.  Negative for dizziness, focal weakness, seizures and headaches.  Endo/Heme/Allergies: Negative.   Psychiatric/Behavioral: Positive for depression. Negative for suicidal ideas. The patient has insomnia.     Past Medical History:  Diagnosis Date  . COPD (chronic obstructive pulmonary disease) (HCC)   . Glaucoma   . Gout   . Hyperlipidemia   . Hypertension     Past Surgical History:  Procedure Laterality Date  . HEMORRHOID SURGERY      Family History  Problem Relation Age of Onset  . Hypertension Father   . Glaucoma Father     Social History Reviewed with no changes to be made today.   Outpatient Medications Prior to Visit  Medication Sig Dispense Refill  . atorvastatin (LIPITOR) 20 MG tablet Take 1 tablet (20 mg total) by mouth daily. 90 tablet 0  . dorzolamide-timolol (COSOPT) 22.3-6.8 MG/ML ophthalmic solution Place 1 drop into both eyes 2 (two) times daily.    . Glycopyrrolate-Formoterol (BEVESPI AEROSPHERE) 9-4.8 MCG/ACT AERO Inhale 2 puffs into the  lungs 2 (two) times daily. 2 Inhaler 0  . lisinopril (PRINIVIL,ZESTRIL) 40 MG tablet Take 1 tablet (40 mg total) by mouth daily. 90 tablet 3  . metoprolol succinate (TOPROL-XL) 25 MG 24 hr tablet Take 1 tablet (25 mg total) by mouth daily. 90 tablet 3  . timolol (TIMOPTIC) 0.25 % ophthalmic solution 1 drop 2 (two) times daily.    Marland Kitchen amLODipine (NORVASC) 5 MG tablet Take 1 tablet (5 mg total)  by mouth daily. 90 tablet 3   No facility-administered medications prior to visit.     No Known Allergies     Objective:    BP 119/79 (BP Location: Left Arm, Patient Position: Sitting, Cuff Size: Normal)   Pulse 69   Temp 97.8 F (36.6 C) (Oral)   Ht 5\' 9"  (1.753 m)   Wt 122 lb 9.6 oz (55.6 kg)   SpO2 100%   BMI 18.10 kg/m  Wt Readings from Last 3 Encounters:  06/02/18 122 lb 9.6 oz (55.6 kg)  05/21/18 122 lb 3.2 oz (55.4 kg)  04/21/18 126 lb 6.4 oz (57.3 kg)    Physical Exam  Constitutional: He is oriented to person, place, and time. He appears cachectic. He has a sickly appearance.  HENT:  Head: Normocephalic and atraumatic.  Right Ear: Hearing, tympanic membrane, external ear and ear canal normal.  Left Ear: Hearing, tympanic membrane, external ear and ear canal normal.  Nose: Nose normal. No mucosal edema or rhinorrhea.  Mouth/Throat: Uvula is midline, oropharynx is clear and moist and mucous membranes are normal. Tonsils are 1+ on the right. Tonsils are 1+ on the left. No tonsillar exudate.  Eyes: Pupils are equal, round, and reactive to light. Conjunctivae, EOM and lids are normal. No scleral icterus.  Neck: Normal range of motion. Neck supple. No tracheal deviation present. No thyromegaly present.  Cardiovascular: Normal rate, regular rhythm, normal heart sounds and intact distal pulses. Exam reveals no gallop and no friction rub.  No murmur heard. Pulses:      Dorsalis pedis pulses are 1+ on the right side, and 1+ on the left side.       Posterior tibial pulses are 1+ on the right side, and 1+ on the left side.  Pulmonary/Chest: Effort normal and breath sounds normal. No respiratory distress. He has no wheezes. He has no rales. He exhibits no mass and no tenderness. Right breast exhibits no inverted nipple, no mass, no nipple discharge, no skin change and no tenderness. Left breast exhibits no inverted nipple, no mass, no nipple discharge, no skin change and no  tenderness.  Abdominal: Soft. Bowel sounds are normal. He exhibits no distension and no mass. There is no tenderness. There is no rebound and no guarding. Hernia confirmed negative in the right inguinal area and confirmed negative in the left inguinal area.  Genitourinary: Testes normal and penis normal. Right testis shows no mass, no swelling and no tenderness. Right testis is descended. Cremasteric reflex is not absent on the right side. Left testis shows no mass, no swelling and no tenderness. Left testis is descended. Cremasteric reflex is not absent on the left side.  Musculoskeletal: Normal range of motion. He exhibits no edema, tenderness or deformity.  Feet:  Right Foot:  Protective Sensation: 10 sites tested. 10 sites sensed.  Left Foot:  Protective Sensation: 10 sites tested. 10 sites sensed.  Lymphadenopathy:       Head (right side): No submental, no submandibular, no tonsillar, no preauricular, no posterior auricular and no  occipital adenopathy present.       Head (left side): No submental, no submandibular, no tonsillar, no preauricular, no posterior auricular and no occipital adenopathy present.    He has no cervical adenopathy.    He has no axillary adenopathy. No inguinal adenopathy noted on the right or left side.       Right: No inguinal adenopathy present.       Left: No inguinal adenopathy present.  Neurological: He is alert and oriented to person, place, and time. He displays tremor (gross; bilateral hands). He displays normal reflexes. He exhibits normal muscle tone. Coordination normal.  Reflex Scores:      Patellar reflexes are 2+ on the right side and 2+ on the left side. Skin: Skin is warm and dry. Capillary refill takes less than 2 seconds. No erythema.  Dusky and ruddy appearing BLE as well as face. Bilateral feet are warm to touch with palpable DPP.   Psychiatric: He has a normal mood and affect. His speech is normal and behavior is normal. Judgment and thought  content normal. Cognition and memory are normal.  He appears very unkempt today       Patient has been counseled extensively about nutrition and exercise as well as the importance of adherence with medications and regular follow-up. The patient was given clear instructions to go to ER or return to medical center if symptoms don't improve, worsen or new problems develop. The patient verbalized understanding.   Follow-up: Return in about 4 weeks (around 06/30/2018) for BP  rechck with pharmacist in 4 weeks then see below.  Schedule ABIs with [facility].   Claiborne Rigg, FNP-BC [facility], [place] [phone]   06/02/2018, 5:03 PM

## 2018-06-03 LAB — LIPASE: Lipase: 64 U/L (ref 13–78)

## 2018-06-03 LAB — AMYLASE: Amylase: 21 U/L — ABNORMAL LOW (ref 31–124)

## 2018-06-04 ENCOUNTER — Telehealth: Payer: Self-pay

## 2018-06-04 ENCOUNTER — Encounter: Payer: Self-pay | Admitting: Nurse Practitioner

## 2018-06-04 NOTE — Telephone Encounter (Signed)
CMA spoke to patient to inform on results.  Patient verified DOB. Patient understood.  

## 2018-06-04 NOTE — Telephone Encounter (Signed)
-----   Message from Claiborne Rigg, NP sent at 06/04/2018  1:25 PM EDT ----- Pancreatic enzymes are normal. There do not appear to be any intestinal problems causing your lack of appetite. Protein and nutritional shakes are great sources of nutrition.

## 2018-06-10 ENCOUNTER — Encounter: Payer: Self-pay | Admitting: Emergency Medicine

## 2018-06-10 ENCOUNTER — Ambulatory Visit (INDEPENDENT_AMBULATORY_CARE_PROVIDER_SITE_OTHER): Payer: 59 | Admitting: Emergency Medicine

## 2018-06-10 DIAGNOSIS — Z720 Tobacco use: Secondary | ICD-10-CM | POA: Diagnosis not present

## 2018-06-10 DIAGNOSIS — J431 Panlobular emphysema: Secondary | ICD-10-CM | POA: Diagnosis not present

## 2018-06-10 DIAGNOSIS — R918 Other nonspecific abnormal finding of lung field: Secondary | ICD-10-CM

## 2018-06-10 DIAGNOSIS — R911 Solitary pulmonary nodule: Secondary | ICD-10-CM

## 2018-06-10 NOTE — Addendum Note (Signed)
Addended by: Boone Master E on: 06/10/2018 11:08 AM   Modules accepted: Orders

## 2018-06-10 NOTE — Assessment & Plan Note (Signed)
Tolerating the Bevespi, unclear as to whether it is helping very much yet.  But he is only been on it for 2 weeks.  He is still completing his second sample.  He has not had to fill it, and I am concerned that his coupon will only last until the end of this calendar year.  We may need to get him another one for 2020.  Flu and pneumonia vaccines are up-to-date.

## 2018-06-10 NOTE — Assessment & Plan Note (Signed)
Discussed tobacco cessation with him today.  He is going to try and work to cut down.  He is not ready to set a quit date.

## 2018-06-10 NOTE — Assessment & Plan Note (Signed)
Reviewed this with him today.  Stable 06/02/2018 going all the way back to 09/11/2016.  These look like scar superimposed on some emphysematous change.  He needs a final surveillance CT 05/2019

## 2018-06-10 NOTE — Progress Notes (Signed)
Subjective:    Patient ID: Brent Holt, male    DOB: 1956/01/17, 62 y.o.   MRN: [id number]  HPI  62 year old man, current smoker (80 pack years) with hx HTN, gout, glaucoma, L rib fracture with a pneumothorax about 10 yrs ago. He went to the ED 09/12/16 with exertional dyspnea and a productive cough. ED notes indicate that he had wheezing on exam. He was treated with bronchodilators and did feel some improvement in his symptoms. He was treated with levofloxacin, prednisone, and given albuterol inhaler. He had never been diagnosed with COPD before and presents now for further evaluation. A Ct scan chest was done that shows emphysema, scattered pulm nodules. He heats w wood. He has some limitation on activities by his breathing.   ROV 12/18/16 -- follow up visit for hx trobacco, COPD / emphysema on imaging after recent AE-COPD. Also pulm nodules, planning for repeat Ct chest in 03/2017. Allergic rhinitis > we started flonase, improved. A lot less mucous.  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 He has not needed albuterol since last time. He does still have some exertional dyspnea. Some wheeze. Minimal cough.   ROV 03/31/17 -- Patient has a history of COPD and emphysema, history tobacco use, allergic rhinitis. As above pulmonary function testing has identified severe obstruction with asthmatic features. Also with pulmonary nodules. We repeated his CT scan of the chest on 03/25/17 that I have personally reviewed. This shows stable severe bullous emphysema with some stable nodular change in the superior segment and right upper lobe. No new nodules seen. He believes that his breathing is stable. He has run out of symbicort due to cost. Notes that he misses it. He has also run out of albuterol. Did not desat last time on walk on RA. No overt flares.   ROV 05/21/18 --62 year old man, smoker with hypertension, gout,  remote left pneumothorax (traumatic) followed for severe COPD with asthmatic features, allergic rhinitis.  Also following some pulmonary nodular disease by CT scan of the chest, last 7/18.  A repeat CT scan hasn't been done - having trouble with medical bills. He is off symbicort due to cost. Uses albuterol occasionally, not every day. He is smoking about 1/2 pk a day. He stopped the vapor. No flares, pred, abx.   ROV 06/10/18 --62 year old man who continues to smoke, has severe COPD with asthmatic features, allergic rhinitis, remote traumatic left pneumothorax.  He has pulmonary nodular disease and underwent a CT chest 06/02/2018 that I have reviewed.  This shows stable ovoid right lower lobe nodules that have not changed in appearance or size since 09/11/2016.  There were no new nodules seen. Last visit we tried bevespi, he is tolerating, not sure yet whether he has benefited. He rarely uses albuterol. Still smoking 1/2 pk a day.    Review of Systems  Constitutional: Negative.  Negative for fever and unexpected weight change.  HENT: Negative for congestion, dental problem, ear pain, nosebleeds, postnasal drip, rhinorrhea, sinus pressure, sneezing, sore throat and trouble swallowing.   Eyes: Negative.  Negative for redness and itching.  Respiratory: Positive for shortness of breath. Negative for cough, chest tightness and wheezing.   Cardiovascular: Negative.  Negative for palpitations and leg swelling.  Gastrointestinal: Negative.  Negative for nausea and vomiting.  Genitourinary: Negative.  Negative for dysuria.  Musculoskeletal: Negative.  Negative for joint swelling.  Skin: Negative.  Negative for rash.  Neurological: Negative.  Negative for headaches.  Hematological: Bruises/bleeds easily.  Psychiatric/Behavioral: Negative.  Negative for dysphoric mood. The patient is not nervous/anxious.     Past Medical History:  Diagnosis Date  . COPD (chronic obstructive pulmonary disease) (HCC)   .  Glaucoma   . Gout   . Hyperlipidemia   . Hypertension      Family History  Problem Relation Age of Onset  . Hypertension Father   . Glaucoma Father      Social History   Socioeconomic History  . Marital status: Married    Spouse name: Not on file  . Number of children: Not on file  . Years of education: Not on file  . Highest education level: Not on file  Occupational History  . Not on file  Social Needs  . Financial resource strain: Not on file  . Food insecurity:    Worry: Not on file    Inability: Not on file  . Transportation needs:    Medical: Not on file    Non-medical: Not on file  Tobacco Use  . Smoking status: Current Every Day Smoker    Packs/day: 0.50    Years: 44.00    Pack years: 22.00    Types: Cigarettes  . Smokeless tobacco: Never Used  Substance and Sexual Activity  . Alcohol use: Yes    Comment: 3 1/2 shots of Bourbon   . Drug use: No  . Sexual activity: Not Currently  Lifestyle  . Physical activity:    Days per week: Not on file    Minutes per session: Not on file  . Stress: Not on file  Relationships  . Social connections:    Talks on phone: Not on file    Gets together: Not on file    Attends religious service: Not on file    Active member of club or organization: Not on file    Attends meetings of clubs or organizations: Not on file    Relationship status: Not on file  . Intimate partner violence:    Fear of current or ex partner: Not on file    Emotionally abused: Not on file    Physically abused: Not on file    Forced sexual activity: Not on file  Other Topics Concern  . Not on file  Social History Narrative  . 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 amLODipine (NORVASC) 10 MG tablet Take 1 tablet (10 mg total) by mouth daily. 30 tablet 1  . atorvastatin  (LIPITOR) 20 MG tablet Take 1 tablet (20 mg total) by mouth daily. 90 tablet 0  . dorzolamide-timolol (COSOPT) 22.3-6.8 MG/ML ophthalmic solution Place 1 drop into both eyes 2 (two) times daily.    . Glycopyrrolate-Formoterol (BEVESPI AEROSPHERE) 9-4.8 MCG/ACT AERO Inhale 2 puffs into the lungs 2 (two) times daily. 2 Inhaler 0  . lisinopril (PRINIVIL,ZESTRIL) 40 MG tablet Take 1 tablet (40 mg total) by mouth daily. 90 tablet 3  . metoprolol succinate (TOPROL-XL) 25 MG 24 hr tablet Take 1 tablet (25 mg total) by mouth daily. 90 tablet 3  . timolol (TIMOPTIC) 0.25 % ophthalmic solution 1 drop 2 (two) times daily.     No facility-administered medications prior to visit.         Objective:   Physical Exam Vitals:   06/10/18 1022  BP: 120/76  Pulse: 76  SpO2: 96%  Weight: 124 lb (56.2 kg)  Height: 5\' 9"  (1.753 m)   Gen: Pleasant, well-nourished, in no distress,  normal affect  ENT: No lesions,  mouth clear,  oropharynx clear, no postnasal drip  Neck: No JVD, no stridor  Lungs: No use of accessory muscles, clear without rales or rhonchi  Cardiovascular: RRR, heart sounds normal, no murmur or gallops, no peripheral edema  Musculoskeletal: No deformities, no cyanosis or clubbing  Neuro: alert, non focal  Skin: Warm, no lesions or rashes      Assessment & Plan:  COPD (chronic obstructive pulmonary disease) (HCC) Tolerating the Bevespi, unclear as to whether it is helping very much yet.  But he is only been on it for 2 weeks.  He is still completing his second sample.  He has not had to fill it, and I am concerned that his coupon will only last until the end of this calendar year.  We may need to get him another one for 2020.  Flu and pneumonia vaccines are up-to-date.  Pulmonary nodules Reviewed this with him today.  Stable 06/02/2018 going all the way back to 09/11/2016.  These look like scar superimposed on some emphysematous change.  He needs a final surveillance CT  05/2019  Tobacco abuse Discussed tobacco cessation with him today.  He is going to try and work to cut down.  He is not ready to set a quit date.  Levy Pupa, MD, PhD 06/10/2018, 10:55 AM [facility] [phone] or if no answer [phone]

## 2018-06-10 NOTE — Patient Instructions (Signed)
We will continue Bevespi 2 puffs twice daily. Please continue albuterol 2 puffs up to every 4 hours as needed for shortness breath, chest tightness, wheezing. Your flu and pneumonia vaccines are up-to-date. Your CT scan of the chest shows that your pulmonary nodules are stable in size.  You will need another follow-up CT in September 2020. Please continue to work hard on decreasing your smoking. Follow with Dr Delton Coombes in 6 months or sooner if you have any problems

## 2018-06-30 ENCOUNTER — Ambulatory Visit: Payer: 59 | Attending: Family Medicine | Admitting: Pharmacist

## 2018-06-30 ENCOUNTER — Encounter: Payer: Self-pay | Admitting: Pharmacist

## 2018-06-30 ENCOUNTER — Ambulatory Visit: Payer: 59

## 2018-06-30 VITALS — BP 128/85 | HR 94

## 2018-06-30 DIAGNOSIS — I10 Essential (primary) hypertension: Secondary | ICD-10-CM

## 2018-06-30 MED ORDER — AMLODIPINE BESYLATE 10 MG PO TABS: 10.0000 mg | ORAL_TABLET | Freq: Every day | ORAL | 0 refills | Status: DC

## 2018-06-30 NOTE — Progress Notes (Signed)
   S:  PCP: Bertram Denver    Patient arrives in poor spirits. Presents to the clinic for hypertension management. Patient was referred and last seen by Zelda on 06/02/18. At that time, BP elevated at 172/96. Clonidine given. BP decreased to 119/79. Amlodipine was increased to 10 mg daily.   Denies chest pain, SOB, HA or dizziness. Endorses anxiety/depression. Denies suicidal ideation. Offered social work but patient denies. Denies intent to harm others.   Patient denies adherence with medications.  Current BP Medications include:   - amlodipine 10 mg daily. Been without since Sunday. - lisinopril 40 mg daily - metoprolol XL 25 mg daily  Antihypertensives tried in the past include:  - HCTZ 25 mg daily (stopped d/t gout)  Dietary habits include:  - limits salt - denies drinking coffee in last week Exercise habits include: - Pt does not exercise Family / Social history:  - HTN (father) - Tobacco: 1/2 pack daily - Alcohol: "I am an alcohol" reports daily intake; specific amount/type not given  Home BP readings:  - does not take at home  O:  L arm after 5 minutes rest: 128/85, HR 94 Last 3 Office BP readings: BP Readings from Last 3 Encounters:  06/10/18 120/76  06/02/18 119/79  05/21/18 120/80    BMET    Component Value Date/Time   NA 137 04/21/2018 1117   K 3.9 04/21/2018 1117   CL 101 04/21/2018 1117   CO2 24 04/21/2018 1117   GLUCOSE 138 (H) 04/21/2018 1117   GLUCOSE 85 11/24/2016 1445   BUN 9 04/21/2018 1117   CREATININE 1.03 04/21/2018 1117   CREATININE 0.95 11/24/2016 1445   CALCIUM 8.6 04/21/2018 1117   GFRNONAA 77 04/21/2018 1117   GFRNONAA 86 11/24/2016 1445   GFRAA 90 04/21/2018 1117   GFRAA >89 11/24/2016 1445    Renal function: CrCl cannot be calculated (Patient's most recent lab result is older than the maximum 21 days allowed.).  A/P: Hypertension longstanding improved on current medications. BP Goal <130/80 mmHg. Patient is adherent with  current medications but has been without amlodipine for the past 3 days.  Pt with last lipid panel resulted over 1 yr ago. LDL 37 at that time (down from 77). Patient with no DM. No history of ASCVD. ASCVD risk score is low. Recommend lipid panel with reassessment of Lipitor appropriateness. Will inform patient's PCP. Pt may not require statin.  -Continued anti-hypertensives.  -Sent refills for amlodipine  -Counseled on lifestyle modifications for blood pressure control including reduced dietary sodium, increased exercise, adequate sleep  Results reviewed and written information provided.   Total time in face-to-face counseling 15 minutes.   F/U Clinic Visit w/ PCP.    Patient seen with:  Leanne Chang, PharmD Candidate [employer] of 2021  Butch Penny, PharmD, CPP Clinical Pharmacist [facility] [phone]

## 2018-06-30 NOTE — Patient Instructions (Signed)
Thank you for coming to see us today.   Blood pressure today is improved.   Continue taking blood pressure medications as prescribed.   Limiting salt and caffeine, as well as exercising as able for at least 30 minutes for 5 days out of the week, can also help you lower your blood pressure.  Take your blood pressure at home if you are able. Please write down these numbers and bring them to your visits.  If you have any questions about medications, please call me [phone].  Luke  

## 2018-07-01 ENCOUNTER — Telehealth: Payer: Self-pay | Admitting: Emergency Medicine

## 2018-07-01 MED ORDER — GLYCOPYRROLATE-FORMOTEROL 9-4.8 MCG/ACT IN AERO: 2.0000 | INHALATION_SPRAY | Freq: Two times a day (BID) | RESPIRATORY_TRACT | 11 refills | Status: DC

## 2018-07-01 NOTE — Telephone Encounter (Signed)
Rx was sent to [facility] with the pt and notified that this was done  Nothing further needed

## 2018-08-20 ENCOUNTER — Other Ambulatory Visit: Payer: Self-pay | Admitting: Nurse Practitioner

## 2018-08-20 DIAGNOSIS — E782 Mixed hyperlipidemia: Secondary | ICD-10-CM

## 2018-09-07 ENCOUNTER — Ambulatory Visit: Payer: 59 | Admitting: Nurse Practitioner

## 2018-10-05 ENCOUNTER — Other Ambulatory Visit: Payer: Self-pay | Admitting: Family Medicine

## 2018-10-27 ENCOUNTER — Other Ambulatory Visit: Payer: Self-pay | Admitting: Nurse Practitioner

## 2018-10-27 ENCOUNTER — Telehealth: Payer: Self-pay | Admitting: Emergency Medicine

## 2018-10-27 MED ORDER — GLYCOPYRROLATE-FORMOTEROL 9-4.8 MCG/ACT IN AERO: 2.0000 | INHALATION_SPRAY | Freq: Every day | RESPIRATORY_TRACT | 0 refills | Status: DC

## 2018-10-27 NOTE — Telephone Encounter (Signed)
1) Medication(s) Requested (by name): amlodipine 2) Pharmacy of Choice:  [id number] - [place] ([place]), [place] - [address]  3) Special Requests:  Would like enough supply until his next visit  Approved medications will be sent to the pharmacy, we will reach out if there is an issue.  Requests made after 3pm may not be addressed until the following business day!  If a patient is unsure of the name of the medication(s) please note and ask patient to call back when they are able to provide all info, do not send to responsible party until all information is available!

## 2018-10-27 NOTE — Telephone Encounter (Signed)
Fax received from [facility].  Called [phone], Rosann Auerbach, to start [place] PA.  Call was transferred by [facility] to [employer], [phone]. Spoke with Ladonna Snide, who stated that I needed to speak to a prescription representative , and transferred call to  [facility].  Angelique Blonder stated that she did not understand why she got the call, because Bevespi, did not need approval, and they did not handle medications.  Angelique Blonder took Patients information and Bevespi information, with call back number.  She stated that she was going to have someone look into PA request needed for this Patient, and would call back.

## 2018-10-27 NOTE — Telephone Encounter (Signed)
Called and spoke with Patient.  He stated that his insurance would not cover Bevespi, and cost was $365. He stated that he used the zero pay card, but insurance has to cover some of the cost for it to work.  He stated that his insurance company told him he had to have something signed by our office and they would fax a form to [facility]. Patient stated that he is currently out of Bevespi. Gave directions to the new office and placed a Bevespi sample at front desk for pick up.  No PA forms or papers found downstairs or in RB's box at this time.  [employer], spoke with French Ana.  She stated that a PA was required for Bevespi.  [facility]. Triage fax number given.   Will leave open until PA received and started

## 2018-10-28 NOTE — Telephone Encounter (Signed)
PA originated in triage. Per triage protocol, PA will need to be followed up on by triage.

## 2018-10-28 NOTE — Telephone Encounter (Signed)
Called and spoke with Maralyn Sago with Rosann Auerbach at phone [phone] She advised that there is no PA on file at this time for patient's Bevespi Inhaler  Medication name and strength: Bevespi Aerosphere 9-4.74mcg inhaler Provider: RB Pharmacy: [facility] Patient insurance ID: [id number] [employer] Phone: [phone]  Was the PA started on CMM?  Today online 10/28/2018 If yes, please enter the Key: [id number] - PA Case ID: [id number]  Timeframe for approval/denial: Your information has been submitted and will be reviewed by [employer].  You may close this dialog, return to your dashboard, and perform other tasks.  An electronic determination will be received in CoverMyMeds within 72-120 hours.  You can see the latest determination by locating this request on your dashboard or by reopening this request. You will receive a fax copy of the determination.  If Rosann Auerbach has not responded in 120 hours, contact [employer] at [phone].

## 2018-10-28 NOTE — Telephone Encounter (Signed)
Called [phone], Rosann Auerbach, to initiate [place]  PA, if needed. Spoke with Jonny Ruiz, who stated that PA was required and could start PA.  He transferred call to PA team, spoke with [facility] R.  Bevespi PA started, ref # [id number].  Approval will be received by fax 5-7 business days.  Will route message to [place]

## 2018-10-29 MED ORDER — AMLODIPINE BESYLATE 10 MG PO TABS: 10.0000 mg | ORAL_TABLET | Freq: Every day | ORAL | 0 refills | Status: DC

## 2018-11-01 NOTE — Telephone Encounter (Signed)
As of today, PA is still pending.

## 2018-11-02 NOTE — Telephone Encounter (Signed)
PA checked, still pending.

## 2018-11-03 NOTE — Telephone Encounter (Signed)
PA still pending.  

## 2018-11-12 MED ORDER — UMECLIDINIUM-VILANTEROL 62.5-25 MCG/INH IN AEPB: 1.0000 | INHALATION_SPRAY | Freq: Every day | RESPIRATORY_TRACT | 0 refills | Status: DC

## 2018-11-12 NOTE — Telephone Encounter (Signed)
If he has never tried Anoro, and is willing to do so, then please give him samples. Have him call us to let us know if he tolerates, benefits.

## 2018-11-12 NOTE — Addendum Note (Signed)
Addended by: Christen Butter on: 11/12/2018 02:16 PM   Modules accepted: Orders

## 2018-11-12 NOTE — Telephone Encounter (Signed)
Spoke with the pt and notified of recs per RB  He verbalized understanding  MAR updated  Samples up front for pick up and pt to ask for inhaler teaching once he picks up

## 2018-11-12 NOTE — Telephone Encounter (Signed)
Patient calling to see status on his medication Bevespi. He states he received a letter stating he was denied approval for medication and wants to know what else can be done.

## 2018-11-12 NOTE — Telephone Encounter (Addendum)
Encounter was closed prematurely.  Checked [email or web address] and the status of the PA states that the [place] was denied.    Called pt's [employer] to see why the PA was denied. Spoke with Dondra Prader who stated the PA was denied due to having no indication of trying or failing anoro ellipta.  Pt was called and told of the above info and stated to pt that we would check with Dr. Delton Coombes and then get back with him in regards to next steps. Pt expressed understanding.  Dr. Delton Coombes, please advise if you want [place] to give pt a sample of Anoro for him to try due to this being the indication to why the [facility] PA was denied? Thanks!

## 2018-11-15 ENCOUNTER — Telehealth: Payer: Self-pay | Admitting: Emergency Medicine

## 2018-11-15 NOTE — Telephone Encounter (Signed)
Went to patient in the lobby. Advised him on how to use inhalers. No further questions or concerns. Nothing further needed.

## 2018-11-15 NOTE — Telephone Encounter (Signed)
Patient came in to the office this morning to pick up the samples of Anoro. Helped him earlier. Called and spoke with patient he wanted to know the directions on how to take the Anoro. Advised he is supposed to take the Anoro one puff once daily. Patient verbalized understanding. Nothing further needed.

## 2018-11-19 ENCOUNTER — Other Ambulatory Visit: Payer: Self-pay | Admitting: Nurse Practitioner

## 2018-11-19 DIAGNOSIS — E782 Mixed hyperlipidemia: Secondary | ICD-10-CM

## 2018-11-30 ENCOUNTER — Ambulatory Visit: Payer: PRIVATE HEALTH INSURANCE | Admitting: Nurse Practitioner

## 2018-11-30 ENCOUNTER — Telehealth: Payer: Self-pay | Admitting: Nurse Practitioner

## 2018-11-30 DIAGNOSIS — E782 Mixed hyperlipidemia: Secondary | ICD-10-CM

## 2018-11-30 NOTE — Telephone Encounter (Signed)
1) Medication(s) Requested (by name): amLODipine (NORVASC) 10 MG tablet atorvastatin (LIPITOR) 20 MG tablet    2) Pharmacy of Choice: [facility] - [place] ([place]), [place] - [address]  3) Special Requests:   Approved medications will be sent to the pharmacy, we will reach out if there is an issue.  Requests made after 3pm may not be addressed until the following business day!  If a patient is unsure of the name of the medication(s) please note and ask patient to call back when they are able to provide all info, do not send to responsible party until all information is available!

## 2018-12-01 MED ORDER — AMLODIPINE BESYLATE 10 MG PO TABS: 10.0000 mg | ORAL_TABLET | Freq: Every day | ORAL | 0 refills | Status: DC

## 2018-12-01 MED ORDER — ATORVASTATIN CALCIUM 20 MG PO TABS: 20.0000 mg | ORAL_TABLET | Freq: Every day | ORAL | 0 refills | Status: DC

## 2018-12-01 NOTE — Telephone Encounter (Signed)
RX Sent. 

## 2019-01-14 ENCOUNTER — Other Ambulatory Visit: Payer: Self-pay

## 2019-01-14 ENCOUNTER — Encounter: Payer: Self-pay | Admitting: Nurse Practitioner

## 2019-01-14 ENCOUNTER — Ambulatory Visit: Payer: 59 | Attending: Nurse Practitioner | Admitting: Nurse Practitioner

## 2019-01-14 DIAGNOSIS — Z8639 Personal history of other endocrine, nutritional and metabolic disease: Secondary | ICD-10-CM

## 2019-01-14 DIAGNOSIS — E782 Mixed hyperlipidemia: Secondary | ICD-10-CM | POA: Diagnosis not present

## 2019-01-14 DIAGNOSIS — I10 Essential (primary) hypertension: Secondary | ICD-10-CM | POA: Diagnosis not present

## 2019-01-14 MED ORDER — LISINOPRIL 40 MG PO TABS: 40.0000 mg | ORAL_TABLET | Freq: Every day | ORAL | 1 refills | Status: DC

## 2019-01-14 MED ORDER — ATORVASTATIN CALCIUM 20 MG PO TABS: 20.0000 mg | ORAL_TABLET | Freq: Every day | ORAL | 1 refills | Status: DC

## 2019-01-14 MED ORDER — METOPROLOL SUCCINATE ER 25 MG PO TB24: 25.0000 mg | ORAL_TABLET | Freq: Every day | ORAL | 1 refills | Status: DC

## 2019-01-14 MED ORDER — AMLODIPINE BESYLATE 10 MG PO TABS: 10.0000 mg | ORAL_TABLET | Freq: Every day | ORAL | 1 refills | Status: DC

## 2019-01-14 NOTE — Progress Notes (Signed)
Virtual Visit via Telephone Note Due to national recommendations of social distancing due to COVID 19, telehealth visit is felt to be most appropriate for this patient at this time.  I discussed the limitations, risks, security and privacy concerns of performing an evaluation and management service by telephone and the availability of in person appointments. I also discussed with the patient that there may be a patient responsible charge related to this service. The patient expressed understanding and agreed to proceed.    I connected with Brent Holt on 01/14/19  at   9:47 AM EDT  EDT by telephone and verified that I am speaking with the correct person using two identifiers.   Consent I discussed the limitations, risks, security and privacy concerns of performing an evaluation and management service by telephone and the availability of in person appointments. I also discussed with the patient that there may be a patient responsible charge related to this service. The patient expressed understanding and agreed to proceed.   Location of Patient: Private Residence    [facility]    Persons participating in Telemedicine visit: Brent DenverZelda Fleming FNP-BC YY BacheBien CMA Brent GuadalajaraKerry D Holt    History of Present Illness: Telemedicine visit for: Follow up to HTN and HPL. Past Medical History:  Diagnosis Date  . COPD (chronic obstructive pulmonary disease) (HCC)   . Glaucoma   . Gout   . Hyperlipidemia   . Hypertension     Essential Hypertension Taking lisinopril 40 mg daily, metoprolol XL 25 mg, amlodipine 10 mg daily. Denies chest pain, shortness of breath, palpitations, lightheadedness, dizziness, headaches or BLE edema.  He does not monitor his blood pressure at home.  BP Readings from Last 3 Encounters:  06/30/18 128/85  06/10/18 120/76  06/02/18 119/79   Mixed Hyperlipidemia Lab Results  Component Value Date   LDLCALC 37 04/13/2017   LDL at goal. Taking atorvastatin 20 mg daily. He denies any statin intolerance or myalgias.    Past Surgical History:  Procedure Laterality Date  . HEMORRHOID SURGERY      Family History  Problem Relation Age of Onset  . Hypertension Father   . Glaucoma Father     Social History   Socioeconomic History  . Marital status: Married    Spouse name: Not on file  . Number of children: Not on file  . Years of education: Not on file  . Highest education level: Not on file  Occupational History  . Not on file  Social Needs  . Financial resource strain: Not on file  . Food insecurity:    Worry: Not on file    Inability: Not on file  . Transportation needs:    Medical: Not on file    Non-medical: Not on file  Tobacco Use  . Smoking status: Current Every Day Smoker    Packs/day: 0.50    Years: 44.00    Pack years: 22.00    Types: Cigarettes  . Smokeless tobacco: Never Used  Substance and Sexual Activity  . Alcohol use: Yes    Comment: 3 1/2 shots of Bourbon   . Drug use: No  . Sexual activity: Not Currently  Lifestyle  . Physical activity:    Days per week: Not on file    Minutes per session: Not on file  . Stress: Not on file  Relationships  . Social connections:    Talks on phone: Not on file    Gets together: Not  on file    Attends religious service: Not on file    Active member of club or organization: Not on file    Attends meetings of clubs or organizations: Not on file    Relationship status: Not on file  Other Topics Concern  . Not on file  Social History Narrative  . Not on file     Observations/Objective: Awake, alert and oriented x 3   Review of Systems  Constitutional: Negative for fever, malaise/fatigue and weight loss.  HENT: Negative.  Negative for nosebleeds.   Eyes: Negative.  Negative for blurred vision, double vision and photophobia.  Respiratory: Negative.  Negative for cough and shortness of breath.   Cardiovascular: Negative.  Negative  for chest pain, palpitations and leg swelling.  Gastrointestinal: Negative.  Negative for heartburn, nausea and vomiting.  Musculoskeletal: Negative.  Negative for myalgias.  Neurological: Negative.  Negative for dizziness, focal weakness, seizures and headaches.  Psychiatric/Behavioral: Negative.  Negative for suicidal ideas.    Assessment and Plan: Brent Holt was seen today for medication refill.  Diagnoses and all orders for this visit:  Essential hypertension -     amLODipine (NORVASC) 10 MG tablet; Take 1 tablet (10 mg total) by mouth daily. -     lisinopril (ZESTRIL) 40 MG tablet; Take 1 tablet (40 mg total) by mouth daily. -     metoprolol succinate (TOPROL-XL) 25 MG 24 hr tablet; Take 1 tablet (25 mg total) by mouth daily. Continue all antihypertensives as prescribed.  Remember to bring in your blood pressure log with you for your follow up appointment.  DASH/Mediterranean Diets are healthier choices for HTN.    Mixed hyperlipidemia -     atorvastatin (LIPITOR) 20 MG tablet; Take 1 tablet (20 mg total) by mouth daily. -     Hemoglobin A1c; Future INSTRUCTIONS: Work on a low fat, heart healthy diet and participate in regular aerobic exercise program by working out at least 150 minutes per week; 5 days a week-30 minutes per day. Avoid red meat, fried foods. junk foods, sodas, sugary drinks, unhealthy snacking, alcohol and smoking.  Drink at least 48oz of water per day and monitor your carbohydrate intake daily.    History of vitamin D deficiency -     VITAMIN D 25 Hydroxy (Vit-D Deficiency, Fractures); Future     Follow Up Instructions Return in about 3 months (around 04/16/2019).     I discussed the assessment and treatment plan with the patient. The patient was provided an opportunity to ask questions and all were answered. The patient agreed with the plan and demonstrated an understanding of the instructions.   The patient was advised to call back or seek an in-person  evaluation if the symptoms worsen or if the condition fails to improve as anticipated.  I provided 18 minutes of non-face-to-face time during this encounter including median intraservice time, reviewing previous notes, labs, imaging, medications and explaining diagnosis and management.  Claiborne Rigg, FNP-BC

## 2019-01-28 ENCOUNTER — Other Ambulatory Visit: Payer: PRIVATE HEALTH INSURANCE

## 2019-02-28 ENCOUNTER — Other Ambulatory Visit: Payer: Self-pay

## 2019-02-28 ENCOUNTER — Other Ambulatory Visit: Payer: Self-pay | Admitting: Nurse Practitioner

## 2019-02-28 ENCOUNTER — Ambulatory Visit: Payer: 59 | Attending: Nurse Practitioner | Admitting: Nurse Practitioner

## 2019-02-28 DIAGNOSIS — Z79899 Other long term (current) drug therapy: Secondary | ICD-10-CM

## 2019-02-28 DIAGNOSIS — D539 Nutritional anemia, unspecified: Secondary | ICD-10-CM

## 2019-02-28 DIAGNOSIS — E785 Hyperlipidemia, unspecified: Secondary | ICD-10-CM

## 2019-03-01 ENCOUNTER — Other Ambulatory Visit: Payer: Self-pay | Admitting: Nurse Practitioner

## 2019-03-01 DIAGNOSIS — D539 Nutritional anemia, unspecified: Secondary | ICD-10-CM

## 2019-03-01 DIAGNOSIS — E559 Vitamin D deficiency, unspecified: Secondary | ICD-10-CM

## 2019-03-01 LAB — CBC
Hematocrit: 39.6 % (ref 37.5–51.0)
Hemoglobin: 13.3 g/dL (ref 13.0–17.7)
MCH: 34.2 pg — ABNORMAL HIGH (ref 26.6–33.0)
MCHC: 33.6 g/dL (ref 31.5–35.7)
MCV: 102 fL — ABNORMAL HIGH (ref 79–97)
Platelets: 197 10*3/uL (ref 150–450)
RBC: 3.89 x10E6/uL — ABNORMAL LOW (ref 4.14–5.80)
RDW: 12.9 % (ref 11.6–15.4)
WBC: 7 10*3/uL (ref 3.4–10.8)

## 2019-03-01 LAB — BASIC METABOLIC PANEL
BUN/Creatinine Ratio: 12 (ref 10–24)
BUN: 11 mg/dL (ref 8–27)
CO2: 22 mmol/L (ref 20–29)
Calcium: 8.8 mg/dL (ref 8.6–10.2)
Chloride: 99 mmol/L (ref 96–106)
Creatinine, Ser: 0.95 mg/dL (ref 0.76–1.27)
GFR calc Af Amer: 98 mL/min/{1.73_m2} (ref >59)
GFR calc non Af Amer: 85 mL/min/{1.73_m2} (ref >59)
Glucose: 139 mg/dL — ABNORMAL HIGH (ref 65–99)
Potassium: 4.2 mmol/L (ref 3.5–5.2)
Sodium: 136 mmol/L (ref 134–144)

## 2019-03-01 LAB — LIPID PANEL
Chol/HDL Ratio: 1.8 ratio (ref 0.0–5.0)
Cholesterol, Total: 85 mg/dL — ABNORMAL LOW (ref 100–199)
HDL: 46 mg/dL (ref >39)
LDL Calculated: 17 mg/dL (ref 0–99)
Triglycerides: 112 mg/dL (ref 0–149)
VLDL Cholesterol Cal: 22 mg/dL (ref 5–40)

## 2019-03-01 LAB — VITAMIN D 25 HYDROXY (VIT D DEFICIENCY, FRACTURES): Vit D, 25-Hydroxy: 10.5 ng/mL — ABNORMAL LOW (ref 30.0–100.0)

## 2019-03-01 LAB — HEMOGLOBIN A1C
Est. average glucose Bld gHb Est-mCnc: 85 mg/dL
Hgb A1c MFr Bld: 4.6 % — ABNORMAL LOW (ref 4.8–5.6)

## 2019-03-01 MED ORDER — VITAMIN D (ERGOCALCIFEROL) 1.25 MG (50000 UNIT) PO CAPS: 50000.0000 [IU] | ORAL_CAPSULE | ORAL | 1 refills | Status: DC

## 2019-03-03 ENCOUNTER — Telehealth: Payer: Self-pay | Admitting: Internal Medicine

## 2019-03-03 NOTE — Progress Notes (Signed)
CMA spoke to patient to inform on lab results and PCP advising. Pt. Was aware of referral and have an upcoming appt. With Oncology.  Pt. Was also aware that PCP order additional labs and was inform he may need to come in and get more lab drawn if the add-on is not successful.

## 2019-03-03 NOTE — Telephone Encounter (Signed)
Received a new hem referral from Geryl Rankins, NO for macrocytic anemia. Brent Holt has been cld and scheduled to see Dr. Walden Field on 7/10 at 10:50am. He's been made aware to arrive 20 minutes early. Letter mailed.

## 2019-03-08 ENCOUNTER — Encounter: Payer: Self-pay | Admitting: Internal Medicine

## 2019-03-10 ENCOUNTER — Encounter: Payer: Self-pay | Admitting: Internal Medicine

## 2019-03-15 NOTE — Progress Notes (Signed)
CMA spoke to patient. Patient is aware he have an upcoming appt. With the Oncology.

## 2019-03-18 ENCOUNTER — Telehealth: Payer: Self-pay | Admitting: Hematology

## 2019-03-18 ENCOUNTER — Inpatient Hospital Stay: Payer: 59 | Admitting: Internal Medicine

## 2019-03-18 NOTE — Telephone Encounter (Signed)
Returned patient call re rescheduling new patient visit. Due to [facility] last day end of July patient rescheduled to see YF. Confirmed date/time.

## 2019-03-25 LAB — IRON,TIBC AND FERRITIN PANEL
Ferritin: 188 ng/mL (ref 30–400)
Iron Saturation: 74 % — ABNORMAL HIGH (ref 15–55)
Iron: 143 ug/dL (ref 38–169)
Total Iron Binding Capacity: 193 ug/dL — ABNORMAL LOW (ref 250–450)
UIBC: 50 ug/dL — ABNORMAL LOW (ref 111–343)

## 2019-03-25 LAB — B12 AND FOLATE PANEL
Folate: 5 ng/mL (ref >3.0)
Vitamin B-12: 348 pg/mL (ref 232–1245)

## 2019-03-25 LAB — SPECIMEN STATUS REPORT

## 2019-04-01 NOTE — Progress Notes (Signed)
[name]   [phone] Fax:([phone]) [place] Note   Patient Care Team: Gildardo Pounds, NP as PCP - General (Nurse Practitioner)  Date of Service:  04/05/2019   CHIEF COMPLAINTS/PURPOSE OF CONSULTATION:  Macrocytosis and weight loss   REFERRING PHYSICIAN:  PCP, NP Zelda Flemming  HISTORY OF PRESENTING ILLNESS:  Brent Holt 63 y.o. male is a here because of Macrocytosis. The patient was referred by PCP, Dr. Vella Kohler. The patient presents to the clinic today alone.  He notes he lost 10 pounds in 2 months. He notes he has low appetite and low energy. He notes with COPD he has to sit down every 10-15 minutes. He notes he has had diarrhea with loose stool for a year. He denies blood in stool.   Socially he lives alone and takes care of himself. He is separated. He has 5 children.  He notes he was down to 1/2 ppd he then started smoking more since staying home with COVID-19. He notes he is an alcoholic and drank heavily since he was 63yo with bourbon and beer. He stopped completely on 03/12/19. He quit by himself and denies withdrawals symptoms. He notes he does plan to move to [place] where once of his children live. He will transfer his care there when he moves.   They have a PMHx of COPD on albuterol inhaler. Not on oxygen. He tried anerol but did not tolerate. He has anxiety often. He does have HTN, HLD and glaucoma.  He notes his sister had brain cancer, his maternal uncle unknown cancer. His mother also had a unknown cancer.    REVIEW OF SYSTEMS:   Constitutional: Denies fevers, chills (+) Weight loss (+) Low energy  Eyes: Denies blurriness of vision, double vision or watery eyes Ears, nose, mouth, throat, and face: Denies mucositis or sore throat Respiratory: (+) COPD Cardiovascular: Denies palpitation, chest discomfort or lower extremity swelling Gastrointestinal:  Denies nausea, heartburn (+) chronic Loose stool Skin: Denies  abnormal skin rashes Lymphatics: Denies new lymphadenopathy or easy bruising Neurological:Denies numbness, tingling or new weaknesses Behavioral/Psych: Mood is stable, no new changes  All other systems were reviewed with the patient and are negative.   MEDICAL HISTORY:  Past Medical History:  Diagnosis Date  . Anxiety   . COPD (chronic obstructive pulmonary disease) ([place])   . Glaucoma   . Gout   . Hyperlipidemia   . Hypertension     SURGICAL HISTORY: Past Surgical History:  Procedure Laterality Date  . HEMORRHOID SURGERY      SOCIAL HISTORY: Social History   Socioeconomic History  . Marital status: Legally Separated    Spouse name: Not on file  . Number of children: 5  . Years of education: Not on file  . Highest education level: Not on file  Occupational History  . Not on file  Social Needs  . Financial resource strain: Not on file  . Food insecurity    Worry: Not on file    Inability: Not on file  . Transportation needs    Medical: Not on file    Non-medical: Not on file  Tobacco Use  . Smoking status: Current Every Day Smoker    Packs/day: 0.50    Years: 44.00    Pack years: 22.00    Types: Cigarettes  . Smokeless tobacco: Never Used  Substance and Sexual Activity  . Alcohol use: Yes    Comment: 3 1/2 shots of Bourbon, used to  drink alcohol significantly,, quit in 03/12/2019  . Drug use: No  . Sexual activity: Not Currently  Lifestyle  . Physical activity    Days per week: Not on file    Minutes per session: Not on file  . Stress: Not on file  Relationships  . Social Herbalist on phone: Not on file    Gets together: Not on file    Attends religious service: Not on file    Active member of club or organization: Not on file    Attends meetings of clubs or organizations: Not on file    Relationship status: Not on file  . Intimate partner violence    Fear of current or ex partner: Not on file    Emotionally abused: Not on file     Physically abused: Not on file    Forced sexual activity: Not on file  Other Topics Concern  . Not on file  Social History Narrative  . Not on file    FAMILY HISTORY: Family History  Problem Relation Age of Onset  . Hypertension Father   . Glaucoma Father   . Cancer Mother        ? unknown type cancer   . Cancer Sister        brain cancer  . Cancer Maternal Uncle        unknown type malignnacy     ALLERGIES:  has No Known Allergies.  MEDICATIONS:  Current Outpatient Medications  Medication Sig Dispense Refill  . amLODipine (NORVASC) 10 MG tablet Take 1 tablet (10 mg total) by mouth daily. 90 tablet 1  . atorvastatin (LIPITOR) 20 MG tablet Take 1 tablet (20 mg total) by mouth daily. 90 tablet 1  . dorzolamide-timolol (COSOPT) 22.3-6.8 MG/ML ophthalmic solution Place 1 drop into both eyes 2 (two) times daily.    Marland Kitchen lisinopril (ZESTRIL) 40 MG tablet Take 1 tablet (40 mg total) by mouth daily. 90 tablet 1  . metoprolol succinate (TOPROL-XL) 25 MG 24 hr tablet Take 1 tablet (25 mg total) by mouth daily. 90 tablet 1  . Vitamin D, Ergocalciferol, (DRISDOL) 1.25 MG (50000 UT) CAPS capsule Take 1 capsule (50,000 Units total) by mouth every 7 (seven) days. 12 capsule 1  . BESIVANCE 0.6 % SUSP Place 1 drop into the left eye 3 (three) times daily.    . folic acid (FOLVITE) 1 MG tablet Take 1 tablet (1 mg total) by mouth daily. 30 tablet 10   No current facility-administered medications for this visit.     PHYSICAL EXAMINATION: ECOG PERFORMANCE STATUS: 1 - Symptomatic but completely ambulatory  Vitals:   04/05/19 1140  BP: (!) 143/75  Pulse: 73  Resp: 20  Temp: 97.7 F (36.5 C)  SpO2: 98%   Filed Weights   04/05/19 1140  Weight: 115 lb 6.4 oz (52.3 kg)    GENERAL:alert, no distress and comfortable SKIN: skin color, texture, turgor are normal, no rashes or significant lesions EYES: normal, Conjunctiva are pink and non-injected, sclera clear  NECK: supple, thyroid normal  size, non-tender, without nodularity LYMPH:  no palpable lymphadenopathy in the cervical, axillary  LUNGS: clear to auscultation and percussion with normal breathing effort HEART: regular rate & rhythm and no murmurs and no lower extremity edema ABDOMEN:abdomen soft, non-tender and normal bowel sounds Musculoskeletal:no cyanosis of digits and no clubbing  NEURO: alert & oriented x 3 with fluent speech, no focal motor/sensory deficits  LABORATORY DATA:  I have reviewed the data as  listed CBC Latest Ref Rng & Units 04/05/2019 02/28/2019 04/21/2018  WBC 4.0 - 10.5 K/uL 8.0 7.0 6.7  Hemoglobin 13.0 - 17.0 g/dL 13.6 13.3 14.9  Hematocrit 39.0 - 52.0 % 40.2 39.6 44.2  Platelets 150 - 400 K/uL 231 197 191    CMP Latest Ref Rng & Units 04/05/2019 02/28/2019 04/21/2018  Glucose 70 - 99 mg/dL 97 139(H) 138(H)  BUN 8 - 23 mg/dL 6(L) 11 9  Creatinine 0.61 - 1.24 mg/dL 0.98 0.95 1.03  Sodium 135 - 145 mmol/L 136 136 137  Potassium 3.5 - 5.1 mmol/L 3.6 4.2 3.9  Chloride 98 - 111 mmol/L 100 99 101  CO2 22 - 32 mmol/L '27 22 24  '$ Calcium 8.9 - 10.3 mg/dL 9.4 8.8 8.6  Total Protein 6.5 - 8.1 g/dL 7.9 - 6.9  Total Bilirubin 0.3 - 1.2 mg/dL 0.7 - 0.6  Alkaline Phos 38 - 126 U/L 77 - 54  AST 15 - 41 U/L 13(L) - 34  ALT 0 - 44 U/L 9 - 16     Results for JARAMIAH, BOSSARD (MRN [id number]) as of 04/04/2019 13:16  Ref. Range 02/28/2019 09:13  Iron Latest Ref Range: 38 - 169 ug/dL 143  UIBC Latest Ref Range: 111 - 343 ug/dL 50 (L)  TIBC Latest Ref Range: 250 - 450 ug/dL 193 (L)  Ferritin Latest Ref Range: 30 - 400 ng/mL 188  Iron Saturation Latest Ref Range: 15 - 55 % 74 (H)  Folate Latest Ref Range: >3.0 ng/mL 5.0    Results for FREMONT, SKALICKY (MRN [id number]) as of 04/04/2019 13:16  Ref. Range 02/28/2019 09:13  Iron Latest Ref Range: 38 - 169 ug/dL 143  UIBC Latest Ref Range: 111 - 343 ug/dL 50 (L)  TIBC Latest Ref Range: 250 - 450 ug/dL 193 (L)  Ferritin Latest Ref Range: 30 - 400 ng/mL 188  Iron  Saturation Latest Ref Range: 15 - 55 % 74 (H)  Folate Latest Ref Range: >3.0 ng/mL 5.0    RADIOGRAPHIC STUDIES: I have personally reviewed the radiological images as listed and agreed with the findings in the report. No results found.  ASSESSMENT & PLAN:  BENTON TOOKER is a 63 y.o. Caucasian male with a history of COPD, HTN, HLD, Claucoma.    1. Macrocytosis, without anemia  -I reviewed his recent lab work. MCV 102, MCH 34.2, Vitamin D 10.5, Iron 74, Normal B12 and Folate. His CBC in 2018 was normal MCV 95 -I discussed elevated MCV can be from his history of heavy alcohol drinking. Other possibility is liver disease (related to alcohol), hypothyroidism, MDS etc. Although his B12 level was normal, I will check methylmalonic acid level to rule out a mild B12 deficiency.  Will check a TSH.  Given his normal blood counts, my suspicion for MDS is low, I do not think he needs bone marrow biopsy for now. -I recommend liver image  -I discussed his macrocytosis is mild. With his alcohol cessation his macrocytosis will likely resolve within a year. I advised him to take folic acid daily. He is agreeable, I prescribed today -Physical exam unremarkable today  -F/u open, as needed    2. Alcoholism  -He has been a heavy drinker since he was 63 yo. He quit drinking completely on 03/12/19. No significant withdraw symptoms  -I encouraged him to continue cessation.   3. Smoking Cessation, COPD  -He previously reduced to 1/2ppd but has increased recently since COVID-19. I discussed smoking cessation as this can  worsen his COPD, lead to cancer and overall negatively impact his health. He understands and willing to quit.  -He does have COPD. He is on albuterol inhaler  -He is due to CT chest given his history of lung nodules and increased risk of lung cancer. Will obtain will full body scan.   4. Weight loss, cance screening  -possible related to alcohol -Due to his risk for lung cancer and liver cancer,  and weight loss, I recommend a CT chest, abdomen pelvis in the next few weeks.  He is agreeable. -I recommend him to get colon cancer screening with PCP -will check TSH and PSA    4. HLD, HTN -On Lipitor, metoprolol and lisinopril   5. Weight loss, Chronic loose stool  -He has been losing weight over the past 2 years and this worsened in the past 6 months with lower energy.  -He has had chronic loose stool daily with no evidence of blood in stool/GI bleeding.   6. Cancer screening -He is at high risk of liver cancer and lung cancer given h/o alcoholism and smoking.  -Will obtain full body CT scan to further evaluate.  -He has had chronic loose stool, I still request he have colonoscopy or cologuard for GI cancer screenings.  -Will add PSA to baseline labs today    PLAN:  -I prescribed him folic acid today  -CT CAP W [place] tin 2-3 weeks, will call him with results  -Lab today  -F/u open, he is moving to [place] in a few months   Orders Placed This Encounter  Procedures  . CT Abdomen Pelvis W Contrast    Standing Status:   Future    Standing Expiration Date:   04/04/2020    Order Specific Question:   If indicated for the ordered procedure, I authorize the administration of contrast media per Radiology protocol    Answer:   Yes    Order Specific Question:   Preferred imaging location?    Answer:   [facility]    Order Specific Question:   Is Oral Contrast requested for this exam?    Answer:   Yes, Per Radiology protocol    Order Specific Question:   Radiology Contrast Protocol - do NOT remove file path    Answer:   \\[email or web address]  . CT Chest W Contrast    Standing Status:   Future    Standing Expiration Date:   04/04/2020    Order Specific Question:   If indicated for the ordered procedure, I authorize the administration of contrast media per Radiology protocol    Answer:   Yes    Order Specific Question:   Preferred imaging location?     Answer:   [facility]    Order Specific Question:   Radiology Contrast Protocol - do NOT remove file path    Answer:   \\[email or web address]  . CBC with Differential ([place] Only)    Standing Status:   Future    Number of Occurrences:   1    Standing Expiration Date:   04/04/2020  . Retic Panel    Standing Status:   Future    Number of Occurrences:   1    Standing Expiration Date:   04/04/2020  . Technologist smear review    Standing Status:   Future    Number of Occurrences:   1    Standing Expiration Date:   04/04/2020  . TSH    Standing Status:  Future    Number of Occurrences:   1    Standing Expiration Date:   04/04/2020  . PSA, total and free    Standing Status:   Future    Number of Occurrences:   1    Standing Expiration Date:   04/04/2020  . Methylmalonic acid, serum    Standing Status:   Future    Number of Occurrences:   1    Standing Expiration Date:   04/04/2020  . CMP ([place] only)    Standing Status:   Future    Number of Occurrences:   1    Standing Expiration Date:   04/04/2020    All questions were answered. The patient knows to call the clinic with any problems, questions or concerns. I spent 35 minutes counseling the patient face to face. The total time spent in the appointment was 45 minutes and more than 50% was on counseling.     Truitt Merle, MD 04/05/2019 11:16 PM  I, Joslyn Devon, am acting as scribe for Truitt Merle, MD.   I have reviewed the above documentation for accuracy and completeness, and I agree with the above.

## 2019-04-05 ENCOUNTER — Telehealth: Payer: Self-pay | Admitting: Nurse Practitioner

## 2019-04-05 ENCOUNTER — Encounter: Payer: Self-pay | Admitting: Hematology

## 2019-04-05 ENCOUNTER — Inpatient Hospital Stay: Payer: PRIVATE HEALTH INSURANCE

## 2019-04-05 ENCOUNTER — Other Ambulatory Visit: Payer: Self-pay

## 2019-04-05 ENCOUNTER — Inpatient Hospital Stay: Payer: PRIVATE HEALTH INSURANCE | Attending: Hematology | Admitting: Hematology

## 2019-04-05 VITALS — BP 143/75 | HR 73 | Temp 97.7°F | Resp 20 | Ht 69.0 in | Wt 115.4 lb

## 2019-04-05 DIAGNOSIS — Z125 Encounter for screening for malignant neoplasm of prostate: Secondary | ICD-10-CM

## 2019-04-05 DIAGNOSIS — J449 Chronic obstructive pulmonary disease, unspecified: Secondary | ICD-10-CM | POA: Diagnosis not present

## 2019-04-05 DIAGNOSIS — Z79899 Other long term (current) drug therapy: Secondary | ICD-10-CM

## 2019-04-05 DIAGNOSIS — F1020 Alcohol dependence, uncomplicated: Secondary | ICD-10-CM | POA: Diagnosis not present

## 2019-04-05 DIAGNOSIS — I10 Essential (primary) hypertension: Secondary | ICD-10-CM | POA: Insufficient documentation

## 2019-04-05 DIAGNOSIS — R634 Abnormal weight loss: Secondary | ICD-10-CM

## 2019-04-05 DIAGNOSIS — D7589 Other specified diseases of blood and blood-forming organs: Secondary | ICD-10-CM | POA: Diagnosis not present

## 2019-04-05 DIAGNOSIS — F17210 Nicotine dependence, cigarettes, uncomplicated: Secondary | ICD-10-CM

## 2019-04-05 DIAGNOSIS — E785 Hyperlipidemia, unspecified: Secondary | ICD-10-CM | POA: Diagnosis not present

## 2019-04-05 DIAGNOSIS — R918 Other nonspecific abnormal finding of lung field: Secondary | ICD-10-CM

## 2019-04-05 DIAGNOSIS — F419 Anxiety disorder, unspecified: Secondary | ICD-10-CM | POA: Diagnosis not present

## 2019-04-05 LAB — CMP (CANCER CENTER ONLY)
ALT: 9 U/L (ref 0–44)
AST: 13 U/L — ABNORMAL LOW (ref 15–41)
Albumin: 3.5 g/dL (ref 3.5–5.0)
Alkaline Phosphatase: 77 U/L (ref 38–126)
Anion gap: 9 (ref 5–15)
BUN: 6 mg/dL — ABNORMAL LOW (ref 8–23)
CO2: 27 mmol/L (ref 22–32)
Calcium: 9.4 mg/dL (ref 8.9–10.3)
Chloride: 100 mmol/L (ref 98–111)
Creatinine: 0.98 mg/dL (ref 0.61–1.24)
GFR, Est AFR Am: >60 mL/min (ref >60)
GFR, Estimated: >60 mL/min (ref >60)
Glucose, Bld: 97 mg/dL (ref 70–99)
Potassium: 3.6 mmol/L (ref 3.5–5.1)
Sodium: 136 mmol/L (ref 135–145)
Total Bilirubin: 0.7 mg/dL (ref 0.3–1.2)
Total Protein: 7.9 g/dL (ref 6.5–8.1)

## 2019-04-05 LAB — CBC WITH DIFFERENTIAL (CANCER CENTER ONLY)
Abs Immature Granulocytes: 0.03 10*3/uL (ref 0.00–0.07)
Basophils Absolute: 0 10*3/uL (ref 0.0–0.1)
Basophils Relative: 1 %
Eosinophils Absolute: 0.2 10*3/uL (ref 0.0–0.5)
Eosinophils Relative: 3 %
HCT: 40.2 % (ref 39.0–52.0)
Hemoglobin: 13.6 g/dL (ref 13.0–17.0)
Immature Granulocytes: 0 %
Lymphocytes Relative: 28 %
Lymphs Abs: 2.3 10*3/uL (ref 0.7–4.0)
MCH: 32.5 pg (ref 26.0–34.0)
MCHC: 33.8 g/dL (ref 30.0–36.0)
MCV: 95.9 fL (ref 80.0–100.0)
Monocytes Absolute: 0.7 10*3/uL (ref 0.1–1.0)
Monocytes Relative: 8 %
Neutro Abs: 4.8 10*3/uL (ref 1.7–7.7)
Neutrophils Relative %: 60 %
Platelet Count: 231 10*3/uL (ref 150–400)
RBC: 4.19 MIL/uL — ABNORMAL LOW (ref 4.22–5.81)
RDW: 12.1 % (ref 11.5–15.5)
WBC Count: 8 10*3/uL (ref 4.0–10.5)
nRBC: 0 % (ref 0.0–0.2)

## 2019-04-05 LAB — TECHNOLOGIST SMEAR REVIEW

## 2019-04-05 LAB — TSH: TSH: 1.502 u[IU]/mL (ref 0.320–4.118)

## 2019-04-05 LAB — RETIC PANEL
Immature Retic Fract: 6.7 % (ref 2.3–15.9)
RBC.: 4.15 MIL/uL — ABNORMAL LOW (ref 4.22–5.81)
Retic Count, Absolute: 50.2 10*3/uL (ref 19.0–186.0)
Retic Ct Pct: 1.2 % (ref 0.4–3.1)
Reticulocyte Hemoglobin: 35.1 pg (ref >27.9)

## 2019-04-05 MED ORDER — FOLIC ACID 1 MG PO TABS: 1.0000 mg | ORAL_TABLET | Freq: Every day | ORAL | 10 refills | Status: AC

## 2019-04-05 NOTE — Telephone Encounter (Signed)
Gave avs, calendar and contrast  °

## 2019-04-06 LAB — PSA, TOTAL AND FREE
PSA, Free Pct: 32.5 %
PSA, Free: 0.26 ng/mL
Prostate Specific Ag, Serum: 0.8 ng/mL (ref 0.0–4.0)

## 2019-04-07 LAB — METHYLMALONIC ACID, SERUM: Methylmalonic Acid, Quantitative: 286 nmol/L (ref 0–378)

## 2019-04-13 ENCOUNTER — Telehealth: Payer: Self-pay

## 2019-04-13 NOTE — Telephone Encounter (Signed)
-----   Message from Truitt Merle, MD sent at 04/13/2019  7:12 AM EDT ----- Please let pt know his lab results, his CBC was WNL, no macrocytosis (increased RBC size) which was the reason he was referred, CMP, TSH, B12 and PSA were all WNL, no concerns, will cal him after CT scan results return, thanks   Truitt Merle  04/13/2019

## 2019-04-13 NOTE — Telephone Encounter (Signed)
Spoke to patient regarding lab results, per Dr. Burr Medico CBC was wnl, no macrocytotis which was why he was referred, CMP, TSH, B12 and PSA all WNL, no concerns.  Informed him she will call with CT scan results.  He verbalized an understanding.

## 2019-04-14 ENCOUNTER — Other Ambulatory Visit: Payer: Self-pay | Admitting: Hematology

## 2019-04-18 ENCOUNTER — Other Ambulatory Visit: Payer: 59

## 2019-04-18 ENCOUNTER — Inpatient Hospital Stay: Admission: RE | Admit: 2019-04-18 | Payer: 59 | Source: Ambulatory Visit

## 2019-04-20 ENCOUNTER — Telehealth: Payer: Self-pay | Admitting: Emergency Medicine

## 2019-04-20 NOTE — Telephone Encounter (Signed)
Error

## 2019-05-03 ENCOUNTER — Other Ambulatory Visit: Payer: Self-pay | Admitting: Nurse Practitioner

## 2019-05-03 DIAGNOSIS — J431 Panlobular emphysema: Secondary | ICD-10-CM

## 2019-05-03 NOTE — Telephone Encounter (Signed)
Pt does not have an albuterol inhaler listed in his medications. Last seen in clinic by PCP 01/14/19. Please fill if appropriate.

## 2019-05-26 ENCOUNTER — Encounter: Payer: Self-pay | Admitting: Emergency Medicine

## 2019-05-31 ENCOUNTER — Telehealth: Payer: Self-pay | Admitting: Emergency Medicine

## 2019-05-31 NOTE — Telephone Encounter (Signed)
Forwarding to RB to make aware of pt status.

## 2019-08-03 ENCOUNTER — Other Ambulatory Visit: Payer: Self-pay | Admitting: Nurse Practitioner

## 2019-08-03 DIAGNOSIS — J431 Panlobular emphysema: Secondary | ICD-10-CM

## 2019-08-05 ENCOUNTER — Other Ambulatory Visit: Payer: Self-pay | Admitting: Nurse Practitioner

## 2019-08-05 DIAGNOSIS — I10 Essential (primary) hypertension: Secondary | ICD-10-CM

## 2019-10-12 ENCOUNTER — Other Ambulatory Visit: Payer: Self-pay | Admitting: Nurse Practitioner

## 2019-11-30 ENCOUNTER — Other Ambulatory Visit: Payer: Self-pay

## 2019-11-30 ENCOUNTER — Encounter: Payer: Self-pay | Admitting: Nurse Practitioner

## 2019-11-30 ENCOUNTER — Ambulatory Visit: Payer: 59 | Attending: Nurse Practitioner | Admitting: Nurse Practitioner

## 2019-11-30 DIAGNOSIS — D539 Nutritional anemia, unspecified: Secondary | ICD-10-CM

## 2019-11-30 DIAGNOSIS — I10 Essential (primary) hypertension: Secondary | ICD-10-CM

## 2019-11-30 DIAGNOSIS — E782 Mixed hyperlipidemia: Secondary | ICD-10-CM

## 2019-11-30 DIAGNOSIS — Z1211 Encounter for screening for malignant neoplasm of colon: Secondary | ICD-10-CM

## 2019-11-30 DIAGNOSIS — J431 Panlobular emphysema: Secondary | ICD-10-CM | POA: Diagnosis not present

## 2019-11-30 MED ORDER — METOPROLOL SUCCINATE ER 25 MG PO TB24: 25.0000 mg | ORAL_TABLET | Freq: Every day | ORAL | 0 refills | Status: DC

## 2019-11-30 MED ORDER — ATORVASTATIN CALCIUM 20 MG PO TABS: 20.0000 mg | ORAL_TABLET | Freq: Every day | ORAL | 1 refills | Status: DC

## 2019-11-30 MED ORDER — LISINOPRIL 40 MG PO TABS: 40.0000 mg | ORAL_TABLET | Freq: Every day | ORAL | 0 refills | Status: DC

## 2019-11-30 MED ORDER — PROAIR HFA 108 (90 BASE) MCG/ACT IN AERS: 1.0000 | INHALATION_SPRAY | Freq: Four times a day (QID) | RESPIRATORY_TRACT | 1 refills | Status: DC | PRN

## 2019-11-30 MED ORDER — AMLODIPINE BESYLATE 10 MG PO TABS: 10.0000 mg | ORAL_TABLET | Freq: Every day | ORAL | 1 refills | Status: DC

## 2019-11-30 NOTE — Progress Notes (Signed)
Virtual Visit via Telephone Note Due to national recommendations of social distancing due to [place] 19, telehealth visit is felt to be most appropriate for this patient at this time.  I discussed the limitations, risks, security and privacy concerns of performing an evaluation and management service by telephone and the availability of in person appointments. I also discussed with the patient that there may be a patient responsible charge related to this service. The patient expressed understanding and agreed to proceed.    I connected with Eusebio Friendly on 11/30/19  at   1:50 PM EDT  EDT by telephone and verified that I am speaking with the correct person using two identifiers.   Consent I discussed the limitations, risks, security and privacy concerns of performing an evaluation and management service by telephone and the availability of in person appointments. I also discussed with the patient that there may be a patient responsible charge related to this service. The patient expressed understanding and agreed to proceed.   Location of Patient: Private  Residence   Location of Provider: [place] [facility] [place] participating in Telemedicine visit: Geryl Rankins FNP-BC [place]    History of Present Illness: Telemedicine visit for:  Follow Up  He recently moved to [place] and is not sure if he will establish care there or continue with [facility]. Will refill medications today. He is agreeable to having labs drawn in our office next week.   Had cataract surgery and reports doing well with improved vision.   I instructed him that Dr. Agustina Caroli office had attempted to call him on several occasions to schedule lung CT that was due 03-2019. He is aware.   Scheduled for first covid vaccine this Friday. Has been very anxious about going out in public since the pandemic started last year.    Essential Hypertension Not monitoring his blood pressure  his blood pressure at home. Will be purchasing a monitor soon. Denies chest pain, shortness of breath, palpitations, lightheadedness, dizziness, headaches or BLE edema. Currently taking amlodipine 10 mg, lisinopril 40 mg daily and toprol XL 25 mg daily.  BP Readings from Last 3 Encounters:  04/05/19 (!) 143/75  06/30/18 128/85  06/10/18 120/76   Dyslipidemia LDL at goal of <100. Denies statin intolerance and takes atorvastatin 20 mg daily.  Lab Results  Component Value Date   [facility] 17 02/28/2019    Past Medical History:  Diagnosis Date  . Anxiety   . COPD (chronic obstructive pulmonary disease) ([place])   . Glaucoma   . Gout   . Hyperlipidemia   . Hypertension     Past Surgical History:  Procedure Laterality Date  . HEMORRHOID SURGERY      Family History  Problem Relation Age of Onset  . Hypertension Father   . Glaucoma Father   . Cancer Mother        ? unknown type cancer   . Cancer Sister        brain cancer  . Cancer Maternal Uncle        unknown type malignnacy     Social History   Socioeconomic History  . Marital status: Legally Separated    Spouse name: Not on file  . Number of children: 5  . Years of education: Not on file  . Highest education level: Not on file  Occupational History  . Not on file  Tobacco Use  . Smoking status: Current Every Day Smoker  Packs/day: 0.50    Years: 44.00    Pack years: 22.00    Types: Cigarettes  . Smokeless tobacco: Never Used  Substance and Sexual Activity  . Alcohol use: Yes    Comment: 3 1/2 shots of Bourbon, used to drink alcohol significantly,, quit in 03/12/2019  . Drug use: No  . Sexual activity: Not Currently  Other Topics Concern  . Not on file  Social History Narrative  . Not on file   Social Determinants of Health   Financial Resource Strain:   . Difficulty of Paying Living Expenses:   Food Insecurity:   . Worried About Charity fundraiser in the Last Year:   . Arboriculturist in the Last Year:    Transportation Needs:   . Film/video editor (Medical):   Marland Kitchen Lack of Transportation (Non-Medical):   Physical Activity:   . Days of Exercise per Week:   . Minutes of Exercise per Session:   Stress:   . Feeling of Stress :   Social Connections:   . Frequency of Communication with Friends and Family:   . Frequency of Social Gatherings with Friends and Family:   . Attends Religious Services:   . Active Member of Clubs or Organizations:   . Attends Archivist Meetings:   Marland Kitchen Marital Status:      Observations/Objective: Awake, alert and oriented x 3   Review of Systems  Constitutional: Negative for fever, malaise/fatigue and weight loss.  HENT: Negative.  Negative for nosebleeds.   Eyes: Negative.  Negative for blurred vision, double vision and photophobia.  Respiratory: Negative.  Negative for cough and shortness of breath.   Cardiovascular: Negative.  Negative for chest pain, palpitations and leg swelling.  Gastrointestinal: Negative.  Negative for heartburn, nausea and vomiting.  Musculoskeletal: Negative.  Negative for myalgias.  Neurological: Negative.  Negative for dizziness, focal weakness, seizures and headaches.  Psychiatric/Behavioral: Negative.  Negative for suicidal ideas.    Assessment and Plan: Rowdy was seen today for medication refill.  Diagnoses and all orders for this visit:  Essential hypertension -     amLODipine (NORVASC) 10 MG tablet; Take 1 tablet (10 mg total) by mouth daily. -     lisinopril (ZESTRIL) 40 MG tablet; Take 1 tablet (40 mg total) by mouth daily. -     metoprolol succinate (TOPROL-XL) 25 MG 24 hr tablet; Take 1 tablet (25 mg total) by mouth daily. -     CMP14+EGFR; Future Continue all antihypertensives as prescribed.  Remember to bring in your blood pressure log with you for your follow up appointment.  DASH/Mediterranean Diets are healthier choices for HTN.   Mixed hyperlipidemia -     atorvastatin (LIPITOR) 20 MG tablet;  Take 1 tablet (20 mg total) by mouth daily. -     Lipid panel; Future INSTRUCTIONS: Work on a low fat, heart healthy diet and participate in regular aerobic exercise program by working out at least 150 minutes per week; 5 days a week-30 minutes per day. Avoid red meat/beef/steak,  fried foods. junk foods, sodas, sugary drinks, unhealthy snacking, alcohol and smoking.  Drink at least 80 oz of water per day and monitor your carbohydrate intake daily.    Panlobular emphysema (HCC) -     PROAIR HFA 108 (90 Base) MCG/ACT inhaler; Inhale 1-2 puffs into the lungs every 6 (six) hours as needed for wheezing or shortness of breath.  Colon cancer screening -     Ambulatory referral to  Gastroenterology  Macrocytic anemia -     CBC; Future F/U with Dr. Burr Medico     Follow Up Instructions Return in about 12 weeks (around 02/22/2020).     I discussed the assessment and treatment plan with the patient. The patient was provided an opportunity to ask questions and all were answered. The patient agreed with the plan and demonstrated an understanding of the instructions.   The patient was advised to call back or seek an in-person evaluation if the symptoms worsen or if the condition fails to improve as anticipated.  I provided 17 minutes of non-face-to-face time during this encounter including median intraservice time, reviewing previous notes, labs, imaging, medications and explaining diagnosis and management.  Gildardo Pounds, FNP-BC

## 2019-12-02 ENCOUNTER — Telehealth: Payer: Self-pay

## 2019-12-02 DIAGNOSIS — J431 Panlobular emphysema: Secondary | ICD-10-CM

## 2019-12-02 NOTE — Telephone Encounter (Signed)
Can you resend pt's Proair script to [facility] as [facility]?  Generic is preferred on ins.

## 2019-12-05 MED ORDER — ALBUTEROL SULFATE HFA 108 (90 BASE) MCG/ACT IN AERS: 1.0000 | INHALATION_SPRAY | Freq: Four times a day (QID) | RESPIRATORY_TRACT | 2 refills | Status: DC | PRN

## 2019-12-05 NOTE — Telephone Encounter (Signed)
Rx sent 

## 2019-12-05 NOTE — Addendum Note (Signed)
Addended by: Lois Huxley, Jeannett Senior L on: 12/05/2019 11:42 AM   Modules accepted: Orders

## 2019-12-06 ENCOUNTER — Ambulatory Visit: Payer: PRIVATE HEALTH INSURANCE | Attending: Nurse Practitioner

## 2019-12-06 ENCOUNTER — Other Ambulatory Visit: Payer: Self-pay

## 2019-12-06 ENCOUNTER — Telehealth: Payer: Self-pay | Admitting: Nurse Practitioner

## 2019-12-06 DIAGNOSIS — I10 Essential (primary) hypertension: Secondary | ICD-10-CM

## 2019-12-06 DIAGNOSIS — E782 Mixed hyperlipidemia: Secondary | ICD-10-CM

## 2019-12-06 DIAGNOSIS — D539 Nutritional anemia, unspecified: Secondary | ICD-10-CM

## 2019-12-06 NOTE — Telephone Encounter (Signed)
Pt came in and dropped papers off for zelda to look over

## 2019-12-07 LAB — LIPID PANEL
Chol/HDL Ratio: 2.7 ratio (ref 0.0–5.0)
Cholesterol, Total: 139 mg/dL (ref 100–199)
HDL: 51 mg/dL (ref >39)
LDL Chol Calc (NIH): 72 mg/dL (ref 0–99)
Triglycerides: 81 mg/dL (ref 0–149)
VLDL Cholesterol Cal: 16 mg/dL (ref 5–40)

## 2019-12-07 LAB — CMP14+EGFR
ALT: 11 IU/L (ref 0–44)
AST: 16 IU/L (ref 0–40)
Albumin/Globulin Ratio: 1.3 (ref 1.2–2.2)
Albumin: 4 g/dL (ref 3.8–4.8)
Alkaline Phosphatase: 57 IU/L (ref 39–117)
BUN/Creatinine Ratio: 9 — ABNORMAL LOW (ref 10–24)
BUN: 10 mg/dL (ref 8–27)
Bilirubin Total: 0.4 mg/dL (ref 0.0–1.2)
CO2: 22 mmol/L (ref 20–29)
Calcium: 9.5 mg/dL (ref 8.6–10.2)
Chloride: 98 mmol/L (ref 96–106)
Creatinine, Ser: 1.1 mg/dL (ref 0.76–1.27)
GFR calc Af Amer: 82 mL/min/{1.73_m2} (ref >59)
GFR calc non Af Amer: 71 mL/min/{1.73_m2} (ref >59)
Globulin, Total: 3.2 g/dL (ref 1.5–4.5)
Glucose: 133 mg/dL — ABNORMAL HIGH (ref 65–99)
Potassium: 4.7 mmol/L (ref 3.5–5.2)
Sodium: 136 mmol/L (ref 134–144)
Total Protein: 7.2 g/dL (ref 6.0–8.5)

## 2019-12-07 LAB — CBC
Hematocrit: 42.4 % (ref 37.5–51.0)
Hemoglobin: 14.7 g/dL (ref 13.0–17.7)
MCH: 33.9 pg — ABNORMAL HIGH (ref 26.6–33.0)
MCHC: 34.7 g/dL (ref 31.5–35.7)
MCV: 98 fL — ABNORMAL HIGH (ref 79–97)
Platelets: 211 10*3/uL (ref 150–450)
RBC: 4.33 x10E6/uL (ref 4.14–5.80)
RDW: 11.1 % — ABNORMAL LOW (ref 11.6–15.4)
WBC: 8.7 10*3/uL (ref 3.4–10.8)

## 2020-01-19 ENCOUNTER — Encounter: Payer: Self-pay | Admitting: Nurse Practitioner

## 2020-03-09 ENCOUNTER — Other Ambulatory Visit: Payer: Self-pay | Admitting: Nurse Practitioner

## 2020-03-09 DIAGNOSIS — I10 Essential (primary) hypertension: Secondary | ICD-10-CM

## 2020-04-15 ENCOUNTER — Other Ambulatory Visit: Payer: Self-pay | Admitting: Nurse Practitioner

## 2020-04-15 DIAGNOSIS — J431 Panlobular emphysema: Secondary | ICD-10-CM

## 2020-05-24 ENCOUNTER — Other Ambulatory Visit: Payer: Self-pay | Admitting: Nurse Practitioner

## 2020-05-24 DIAGNOSIS — E782 Mixed hyperlipidemia: Secondary | ICD-10-CM

## 2020-06-02 ENCOUNTER — Other Ambulatory Visit: Payer: Self-pay | Admitting: Nurse Practitioner

## 2020-06-02 DIAGNOSIS — I10 Essential (primary) hypertension: Secondary | ICD-10-CM

## 2020-06-29 ENCOUNTER — Other Ambulatory Visit: Payer: Self-pay | Admitting: Nurse Practitioner

## 2020-06-29 DIAGNOSIS — I10 Essential (primary) hypertension: Secondary | ICD-10-CM

## 2020-06-29 NOTE — Telephone Encounter (Signed)
Requested medications are due for refill today yes  Requested medications are on the active medication list yes  Last refill 9/25  Future visit scheduled NO  Notes to clinic Already had a curtesy refill, no upcoming visit scheduled.

## 2020-07-06 ENCOUNTER — Other Ambulatory Visit: Payer: Self-pay | Admitting: Nurse Practitioner

## 2020-07-06 DIAGNOSIS — J431 Panlobular emphysema: Secondary | ICD-10-CM

## 2020-07-09 ENCOUNTER — Ambulatory Visit: Payer: 59 | Admitting: Internal Medicine

## 2020-07-13 ENCOUNTER — Other Ambulatory Visit: Payer: Self-pay | Admitting: Nurse Practitioner

## 2020-07-13 DIAGNOSIS — I10 Essential (primary) hypertension: Secondary | ICD-10-CM

## 2020-08-17 ENCOUNTER — Other Ambulatory Visit: Payer: Self-pay | Admitting: Nurse Practitioner

## 2020-08-17 DIAGNOSIS — I10 Essential (primary) hypertension: Secondary | ICD-10-CM

## 2020-08-17 NOTE — Telephone Encounter (Signed)
Requested medications are due for refill today yes  Requested medications are on the active medication list yes  Last refill 11/5  Last visit 11/2019  Future visit scheduled no  Notes to clinic Failed protocol due to no valid visit within 6  months, already had a curtesy refill.

## 2021-03-09 ENCOUNTER — Other Ambulatory Visit: Payer: Self-pay | Admitting: Nurse Practitioner

## 2021-03-09 DIAGNOSIS — E782 Mixed hyperlipidemia: Secondary | ICD-10-CM

## 2021-03-10 NOTE — Telephone Encounter (Signed)
Requested medication (s) are due for refill today: yes  Requested medication (s) are on the active medication list: yes  Last refill:  05/24/20  Future visit scheduled: no  Notes to clinic:  overdue lab work   Requested Prescriptions  Pending Prescriptions Disp Refills   atorvastatin (LIPITOR) 20 MG tablet [Pharmacy Med Name: ATORVASTATIN 20 MG TABLET] 90 tablet 1    Sig: TAKE 1 TABLET BY MOUTH EVERY DAY      Cardiovascular:  Antilipid - Statins Failed - 03/09/2021  3:09 PM      Failed - Total Cholesterol in normal range and within 360 days    Cholesterol, Total  Date Value Ref Range Status  12/06/2019 139 100 - 199 mg/dL Final          Failed - LDL in normal range and within 360 days    LDL Chol Calc (NIH)  Date Value Ref Range Status  12/06/2019 72 0 - 99 mg/dL Final          Failed - HDL in normal range and within 360 days    HDL  Date Value Ref Range Status  12/06/2019 51 >39 mg/dL Final          Failed - Triglycerides in normal range and within 360 days    Triglycerides  Date Value Ref Range Status  12/06/2019 81 0 - 149 mg/dL Final          Failed - Valid encounter within last 12 months    Recent Outpatient Visits           1 year ago Essential hypertension   [facility] [place], Shea Stakes, NP   2 years ago Essential hypertension   [facility] [place], Shea Stakes, NP   2 years ago Encounter for annual physical exam   [facility] [place], Shea Stakes, NP   2 years ago Essential hypertension   [facility] [place], Shea Stakes, NP   3 years ago Essential hypertension   [facility] [place], [place], [place]                Passed - Patient is not pregnant
